# Patient Record
Sex: Female | Born: 1940 | Race: White | Hispanic: No | Marital: Married | Smoking: Never smoker
Health system: Southern US, Community
[De-identification: ages and names within clinical notes are randomized; demographics above are authoritative.]

## PROBLEM LIST (undated history)

## (undated) DIAGNOSIS — I10 Essential (primary) hypertension: Secondary | ICD-10-CM

## (undated) DIAGNOSIS — I5022 Chronic systolic (congestive) heart failure: Secondary | ICD-10-CM

## (undated) DIAGNOSIS — K219 Gastro-esophageal reflux disease without esophagitis: Secondary | ICD-10-CM

## (undated) DIAGNOSIS — F0390 Unspecified dementia without behavioral disturbance: Secondary | ICD-10-CM

## (undated) DIAGNOSIS — E119 Type 2 diabetes mellitus without complications: Secondary | ICD-10-CM

## (undated) HISTORY — DX: Essential (primary) hypertension: I10

## (undated) HISTORY — PX: CORONARY ARTERY BYPASS GRAFT: SHX141

## (undated) HISTORY — PX: ABDOMINAL HYSTERECTOMY: SUR658

## (undated) HISTORY — PX: CARDIAC CATHETERIZATION: SHX172

## (undated) HISTORY — DX: Gastro-esophageal reflux disease without esophagitis: K21.9

## (undated) HISTORY — DX: Type 2 diabetes mellitus without complications: E11.9

## (undated) HISTORY — PX: ANGIOPLASTY: SHX39

---

## 1998-05-03 ENCOUNTER — Inpatient Hospital Stay (HOSPITAL_COMMUNITY): Admission: AD | Admit: 1998-05-03 | Discharge: 1998-05-15 | Payer: Self-pay | Admitting: Internal Medicine

## 1998-05-07 ENCOUNTER — Encounter: Payer: Self-pay | Admitting: Internal Medicine

## 1998-05-10 ENCOUNTER — Encounter: Payer: Self-pay | Admitting: Internal Medicine

## 1998-05-11 ENCOUNTER — Encounter: Payer: Self-pay | Admitting: Internal Medicine

## 1998-05-12 ENCOUNTER — Encounter: Payer: Self-pay | Admitting: Cardiothoracic Surgery

## 2014-03-01 DIAGNOSIS — E11351 Type 2 diabetes mellitus with proliferative diabetic retinopathy with macular edema: Secondary | ICD-10-CM | POA: Diagnosis not present

## 2014-04-18 DIAGNOSIS — E119 Type 2 diabetes mellitus without complications: Secondary | ICD-10-CM | POA: Diagnosis not present

## 2014-04-18 DIAGNOSIS — I10 Essential (primary) hypertension: Secondary | ICD-10-CM | POA: Diagnosis not present

## 2014-04-18 DIAGNOSIS — I2510 Atherosclerotic heart disease of native coronary artery without angina pectoris: Secondary | ICD-10-CM | POA: Diagnosis not present

## 2014-04-18 DIAGNOSIS — D638 Anemia in other chronic diseases classified elsewhere: Secondary | ICD-10-CM | POA: Diagnosis not present

## 2014-04-18 DIAGNOSIS — E782 Mixed hyperlipidemia: Secondary | ICD-10-CM | POA: Diagnosis not present

## 2014-04-19 DIAGNOSIS — E11351 Type 2 diabetes mellitus with proliferative diabetic retinopathy with macular edema: Secondary | ICD-10-CM | POA: Diagnosis not present

## 2014-05-31 DIAGNOSIS — H3532 Exudative age-related macular degeneration: Secondary | ICD-10-CM | POA: Diagnosis not present

## 2014-05-31 DIAGNOSIS — E11351 Type 2 diabetes mellitus with proliferative diabetic retinopathy with macular edema: Secondary | ICD-10-CM | POA: Diagnosis not present

## 2014-07-12 DIAGNOSIS — E11351 Type 2 diabetes mellitus with proliferative diabetic retinopathy with macular edema: Secondary | ICD-10-CM | POA: Diagnosis not present

## 2014-07-12 DIAGNOSIS — H3532 Exudative age-related macular degeneration: Secondary | ICD-10-CM | POA: Diagnosis not present

## 2014-07-19 DIAGNOSIS — I10 Essential (primary) hypertension: Secondary | ICD-10-CM | POA: Diagnosis not present

## 2014-07-19 DIAGNOSIS — E119 Type 2 diabetes mellitus without complications: Secondary | ICD-10-CM | POA: Diagnosis not present

## 2014-07-19 DIAGNOSIS — E782 Mixed hyperlipidemia: Secondary | ICD-10-CM | POA: Diagnosis not present

## 2014-07-19 DIAGNOSIS — Z6828 Body mass index (BMI) 28.0-28.9, adult: Secondary | ICD-10-CM | POA: Diagnosis not present

## 2014-07-19 DIAGNOSIS — Z139 Encounter for screening, unspecified: Secondary | ICD-10-CM | POA: Diagnosis not present

## 2014-07-20 DIAGNOSIS — E559 Vitamin D deficiency, unspecified: Secondary | ICD-10-CM | POA: Diagnosis not present

## 2014-07-20 DIAGNOSIS — E119 Type 2 diabetes mellitus without complications: Secondary | ICD-10-CM | POA: Diagnosis not present

## 2014-07-20 DIAGNOSIS — E782 Mixed hyperlipidemia: Secondary | ICD-10-CM | POA: Diagnosis not present

## 2014-07-20 DIAGNOSIS — Z79899 Other long term (current) drug therapy: Secondary | ICD-10-CM | POA: Diagnosis not present

## 2014-08-14 DIAGNOSIS — I10 Essential (primary) hypertension: Secondary | ICD-10-CM

## 2014-08-14 DIAGNOSIS — E785 Hyperlipidemia, unspecified: Secondary | ICD-10-CM

## 2014-08-14 DIAGNOSIS — I25810 Atherosclerosis of coronary artery bypass graft(s) without angina pectoris: Secondary | ICD-10-CM

## 2014-08-14 HISTORY — DX: Hyperlipidemia, unspecified: E78.5

## 2014-08-14 HISTORY — DX: Atherosclerosis of coronary artery bypass graft(s) without angina pectoris: I25.810

## 2014-08-14 HISTORY — DX: Essential (primary) hypertension: I10

## 2014-08-16 DIAGNOSIS — E11351 Type 2 diabetes mellitus with proliferative diabetic retinopathy with macular edema: Secondary | ICD-10-CM | POA: Diagnosis not present

## 2014-10-24 DIAGNOSIS — Z6827 Body mass index (BMI) 27.0-27.9, adult: Secondary | ICD-10-CM | POA: Diagnosis not present

## 2014-10-24 DIAGNOSIS — I2510 Atherosclerotic heart disease of native coronary artery without angina pectoris: Secondary | ICD-10-CM | POA: Diagnosis not present

## 2014-10-24 DIAGNOSIS — E119 Type 2 diabetes mellitus without complications: Secondary | ICD-10-CM | POA: Diagnosis not present

## 2014-10-24 DIAGNOSIS — E782 Mixed hyperlipidemia: Secondary | ICD-10-CM | POA: Diagnosis not present

## 2014-10-24 DIAGNOSIS — I10 Essential (primary) hypertension: Secondary | ICD-10-CM | POA: Diagnosis not present

## 2014-11-28 DIAGNOSIS — E119 Type 2 diabetes mellitus without complications: Secondary | ICD-10-CM | POA: Diagnosis not present

## 2014-11-28 DIAGNOSIS — R221 Localized swelling, mass and lump, neck: Secondary | ICD-10-CM | POA: Diagnosis not present

## 2014-11-28 DIAGNOSIS — R0600 Dyspnea, unspecified: Secondary | ICD-10-CM | POA: Diagnosis not present

## 2014-11-28 DIAGNOSIS — I2510 Atherosclerotic heart disease of native coronary artery without angina pectoris: Secondary | ICD-10-CM | POA: Diagnosis not present

## 2014-11-28 DIAGNOSIS — Z7982 Long term (current) use of aspirin: Secondary | ICD-10-CM | POA: Diagnosis not present

## 2014-11-28 DIAGNOSIS — Z79899 Other long term (current) drug therapy: Secondary | ICD-10-CM | POA: Diagnosis not present

## 2014-11-28 DIAGNOSIS — K219 Gastro-esophageal reflux disease without esophagitis: Secondary | ICD-10-CM | POA: Diagnosis not present

## 2014-11-28 DIAGNOSIS — E7800 Pure hypercholesterolemia, unspecified: Secondary | ICD-10-CM | POA: Diagnosis not present

## 2014-11-28 DIAGNOSIS — I10 Essential (primary) hypertension: Secondary | ICD-10-CM | POA: Diagnosis not present

## 2014-11-28 DIAGNOSIS — J392 Other diseases of pharynx: Secondary | ICD-10-CM | POA: Diagnosis not present

## 2014-11-28 DIAGNOSIS — R6889 Other general symptoms and signs: Secondary | ICD-10-CM | POA: Diagnosis not present

## 2014-12-14 DIAGNOSIS — Z23 Encounter for immunization: Secondary | ICD-10-CM | POA: Diagnosis not present

## 2015-02-07 DIAGNOSIS — I10 Essential (primary) hypertension: Secondary | ICD-10-CM | POA: Diagnosis not present

## 2015-02-07 DIAGNOSIS — I25810 Atherosclerosis of coronary artery bypass graft(s) without angina pectoris: Secondary | ICD-10-CM | POA: Diagnosis not present

## 2015-02-07 DIAGNOSIS — E785 Hyperlipidemia, unspecified: Secondary | ICD-10-CM | POA: Diagnosis not present

## 2015-02-14 DIAGNOSIS — E782 Mixed hyperlipidemia: Secondary | ICD-10-CM | POA: Diagnosis not present

## 2015-02-14 DIAGNOSIS — E119 Type 2 diabetes mellitus without complications: Secondary | ICD-10-CM | POA: Diagnosis not present

## 2015-02-14 DIAGNOSIS — Z1389 Encounter for screening for other disorder: Secondary | ICD-10-CM | POA: Diagnosis not present

## 2015-02-14 DIAGNOSIS — E559 Vitamin D deficiency, unspecified: Secondary | ICD-10-CM | POA: Diagnosis not present

## 2015-02-14 DIAGNOSIS — E663 Overweight: Secondary | ICD-10-CM | POA: Diagnosis not present

## 2015-02-14 DIAGNOSIS — Z79899 Other long term (current) drug therapy: Secondary | ICD-10-CM | POA: Diagnosis not present

## 2015-02-14 DIAGNOSIS — I10 Essential (primary) hypertension: Secondary | ICD-10-CM | POA: Diagnosis not present

## 2015-06-17 DIAGNOSIS — E782 Mixed hyperlipidemia: Secondary | ICD-10-CM | POA: Diagnosis not present

## 2015-06-17 DIAGNOSIS — N3281 Overactive bladder: Secondary | ICD-10-CM | POA: Diagnosis not present

## 2015-06-17 DIAGNOSIS — E119 Type 2 diabetes mellitus without complications: Secondary | ICD-10-CM | POA: Diagnosis not present

## 2015-06-17 DIAGNOSIS — I10 Essential (primary) hypertension: Secondary | ICD-10-CM | POA: Diagnosis not present

## 2015-06-17 DIAGNOSIS — Z79899 Other long term (current) drug therapy: Secondary | ICD-10-CM | POA: Diagnosis not present

## 2015-06-17 DIAGNOSIS — Z9181 History of falling: Secondary | ICD-10-CM | POA: Diagnosis not present

## 2015-09-17 DIAGNOSIS — E785 Hyperlipidemia, unspecified: Secondary | ICD-10-CM | POA: Diagnosis not present

## 2015-09-17 DIAGNOSIS — I10 Essential (primary) hypertension: Secondary | ICD-10-CM | POA: Diagnosis not present

## 2015-09-17 DIAGNOSIS — I25810 Atherosclerosis of coronary artery bypass graft(s) without angina pectoris: Secondary | ICD-10-CM | POA: Diagnosis not present

## 2015-10-22 DIAGNOSIS — E119 Type 2 diabetes mellitus without complications: Secondary | ICD-10-CM | POA: Diagnosis not present

## 2015-10-22 DIAGNOSIS — E559 Vitamin D deficiency, unspecified: Secondary | ICD-10-CM | POA: Diagnosis not present

## 2015-10-22 DIAGNOSIS — E782 Mixed hyperlipidemia: Secondary | ICD-10-CM | POA: Diagnosis not present

## 2015-10-22 DIAGNOSIS — I10 Essential (primary) hypertension: Secondary | ICD-10-CM | POA: Diagnosis not present

## 2015-10-22 DIAGNOSIS — Z0000 Encounter for general adult medical examination without abnormal findings: Secondary | ICD-10-CM | POA: Diagnosis not present

## 2015-10-23 DIAGNOSIS — Z79899 Other long term (current) drug therapy: Secondary | ICD-10-CM | POA: Diagnosis not present

## 2015-10-23 DIAGNOSIS — E782 Mixed hyperlipidemia: Secondary | ICD-10-CM | POA: Diagnosis not present

## 2015-10-23 DIAGNOSIS — E559 Vitamin D deficiency, unspecified: Secondary | ICD-10-CM | POA: Diagnosis not present

## 2015-10-23 DIAGNOSIS — E119 Type 2 diabetes mellitus without complications: Secondary | ICD-10-CM | POA: Diagnosis not present

## 2015-12-13 DIAGNOSIS — Z23 Encounter for immunization: Secondary | ICD-10-CM | POA: Diagnosis not present

## 2016-01-21 DIAGNOSIS — Z23 Encounter for immunization: Secondary | ICD-10-CM | POA: Diagnosis not present

## 2016-01-21 DIAGNOSIS — I2510 Atherosclerotic heart disease of native coronary artery without angina pectoris: Secondary | ICD-10-CM | POA: Diagnosis not present

## 2016-01-21 DIAGNOSIS — I10 Essential (primary) hypertension: Secondary | ICD-10-CM | POA: Diagnosis not present

## 2016-01-21 DIAGNOSIS — E119 Type 2 diabetes mellitus without complications: Secondary | ICD-10-CM | POA: Diagnosis not present

## 2016-03-23 DIAGNOSIS — I10 Essential (primary) hypertension: Secondary | ICD-10-CM | POA: Diagnosis not present

## 2016-03-23 DIAGNOSIS — I25810 Atherosclerosis of coronary artery bypass graft(s) without angina pectoris: Secondary | ICD-10-CM | POA: Diagnosis not present

## 2016-03-23 DIAGNOSIS — E785 Hyperlipidemia, unspecified: Secondary | ICD-10-CM | POA: Diagnosis not present

## 2016-05-19 DIAGNOSIS — D638 Anemia in other chronic diseases classified elsewhere: Secondary | ICD-10-CM | POA: Diagnosis not present

## 2016-05-19 DIAGNOSIS — E782 Mixed hyperlipidemia: Secondary | ICD-10-CM | POA: Diagnosis not present

## 2016-05-19 DIAGNOSIS — E119 Type 2 diabetes mellitus without complications: Secondary | ICD-10-CM | POA: Diagnosis not present

## 2016-06-02 DIAGNOSIS — Z1389 Encounter for screening for other disorder: Secondary | ICD-10-CM | POA: Diagnosis not present

## 2016-06-02 DIAGNOSIS — I10 Essential (primary) hypertension: Secondary | ICD-10-CM | POA: Diagnosis not present

## 2016-06-02 DIAGNOSIS — E782 Mixed hyperlipidemia: Secondary | ICD-10-CM | POA: Diagnosis not present

## 2016-06-02 DIAGNOSIS — I2510 Atherosclerotic heart disease of native coronary artery without angina pectoris: Secondary | ICD-10-CM | POA: Diagnosis not present

## 2016-06-02 DIAGNOSIS — E119 Type 2 diabetes mellitus without complications: Secondary | ICD-10-CM | POA: Diagnosis not present

## 2016-06-25 DIAGNOSIS — D649 Anemia, unspecified: Secondary | ICD-10-CM | POA: Diagnosis not present

## 2016-07-10 DIAGNOSIS — D649 Anemia, unspecified: Secondary | ICD-10-CM | POA: Diagnosis not present

## 2016-07-17 DIAGNOSIS — K5730 Diverticulosis of large intestine without perforation or abscess without bleeding: Secondary | ICD-10-CM | POA: Diagnosis not present

## 2016-07-17 DIAGNOSIS — D649 Anemia, unspecified: Secondary | ICD-10-CM | POA: Diagnosis not present

## 2016-07-17 DIAGNOSIS — D126 Benign neoplasm of colon, unspecified: Secondary | ICD-10-CM | POA: Diagnosis not present

## 2016-07-17 DIAGNOSIS — Z791 Long term (current) use of non-steroidal anti-inflammatories (NSAID): Secondary | ICD-10-CM | POA: Diagnosis not present

## 2016-07-17 DIAGNOSIS — K2970 Gastritis, unspecified, without bleeding: Secondary | ICD-10-CM | POA: Diagnosis not present

## 2016-07-17 DIAGNOSIS — D125 Benign neoplasm of sigmoid colon: Secondary | ICD-10-CM | POA: Diagnosis not present

## 2016-07-17 DIAGNOSIS — R1013 Epigastric pain: Secondary | ICD-10-CM | POA: Diagnosis not present

## 2016-07-17 DIAGNOSIS — Z1211 Encounter for screening for malignant neoplasm of colon: Secondary | ICD-10-CM | POA: Diagnosis not present

## 2016-07-23 DIAGNOSIS — D649 Anemia, unspecified: Secondary | ICD-10-CM | POA: Diagnosis not present

## 2016-08-04 DIAGNOSIS — E559 Vitamin D deficiency, unspecified: Secondary | ICD-10-CM | POA: Diagnosis not present

## 2016-08-04 DIAGNOSIS — I2510 Atherosclerotic heart disease of native coronary artery without angina pectoris: Secondary | ICD-10-CM | POA: Diagnosis not present

## 2016-08-04 DIAGNOSIS — I10 Essential (primary) hypertension: Secondary | ICD-10-CM | POA: Diagnosis not present

## 2016-08-04 DIAGNOSIS — Z9181 History of falling: Secondary | ICD-10-CM | POA: Diagnosis not present

## 2016-08-04 DIAGNOSIS — Z139 Encounter for screening, unspecified: Secondary | ICD-10-CM | POA: Diagnosis not present

## 2016-08-04 DIAGNOSIS — E119 Type 2 diabetes mellitus without complications: Secondary | ICD-10-CM | POA: Diagnosis not present

## 2016-08-04 DIAGNOSIS — E782 Mixed hyperlipidemia: Secondary | ICD-10-CM | POA: Diagnosis not present

## 2016-09-07 ENCOUNTER — Ambulatory Visit: Payer: Self-pay | Admitting: Cardiology

## 2016-10-06 DIAGNOSIS — Z01818 Encounter for other preprocedural examination: Secondary | ICD-10-CM | POA: Diagnosis not present

## 2016-10-06 DIAGNOSIS — T148XXA Other injury of unspecified body region, initial encounter: Secondary | ICD-10-CM | POA: Diagnosis not present

## 2016-10-06 DIAGNOSIS — S82201A Unspecified fracture of shaft of right tibia, initial encounter for closed fracture: Secondary | ICD-10-CM | POA: Diagnosis not present

## 2016-10-06 DIAGNOSIS — M25571 Pain in right ankle and joints of right foot: Secondary | ICD-10-CM | POA: Diagnosis not present

## 2016-10-06 DIAGNOSIS — S82891A Other fracture of right lower leg, initial encounter for closed fracture: Secondary | ICD-10-CM | POA: Diagnosis not present

## 2016-10-06 DIAGNOSIS — M7989 Other specified soft tissue disorders: Secondary | ICD-10-CM | POA: Diagnosis not present

## 2016-10-06 DIAGNOSIS — S8991XA Unspecified injury of right lower leg, initial encounter: Secondary | ICD-10-CM | POA: Diagnosis not present

## 2016-10-06 DIAGNOSIS — S82899A Other fracture of unspecified lower leg, initial encounter for closed fracture: Secondary | ICD-10-CM | POA: Diagnosis not present

## 2016-10-06 DIAGNOSIS — S99921A Unspecified injury of right foot, initial encounter: Secondary | ICD-10-CM | POA: Diagnosis not present

## 2016-10-07 DIAGNOSIS — S82841A Displaced bimalleolar fracture of right lower leg, initial encounter for closed fracture: Secondary | ICD-10-CM | POA: Diagnosis not present

## 2016-10-08 DIAGNOSIS — S82841A Displaced bimalleolar fracture of right lower leg, initial encounter for closed fracture: Secondary | ICD-10-CM | POA: Diagnosis not present

## 2016-10-09 DIAGNOSIS — I10 Essential (primary) hypertension: Secondary | ICD-10-CM | POA: Diagnosis not present

## 2016-10-09 DIAGNOSIS — I249 Acute ischemic heart disease, unspecified: Secondary | ICD-10-CM | POA: Diagnosis not present

## 2016-10-09 DIAGNOSIS — Z79899 Other long term (current) drug therapy: Secondary | ICD-10-CM | POA: Diagnosis not present

## 2016-10-09 DIAGNOSIS — Z7401 Bed confinement status: Secondary | ICD-10-CM | POA: Diagnosis not present

## 2016-10-09 DIAGNOSIS — Z9181 History of falling: Secondary | ICD-10-CM | POA: Diagnosis not present

## 2016-10-09 DIAGNOSIS — Z951 Presence of aortocoronary bypass graft: Secondary | ICD-10-CM | POA: Diagnosis not present

## 2016-10-09 DIAGNOSIS — W010XXA Fall on same level from slipping, tripping and stumbling without subsequent striking against object, initial encounter: Secondary | ICD-10-CM | POA: Diagnosis not present

## 2016-10-09 DIAGNOSIS — I2510 Atherosclerotic heart disease of native coronary artery without angina pectoris: Secondary | ICD-10-CM | POA: Diagnosis not present

## 2016-10-09 DIAGNOSIS — R749 Abnormal serum enzyme level, unspecified: Secondary | ICD-10-CM | POA: Diagnosis not present

## 2016-10-09 DIAGNOSIS — E7800 Pure hypercholesterolemia, unspecified: Secondary | ICD-10-CM | POA: Diagnosis not present

## 2016-10-09 DIAGNOSIS — R279 Unspecified lack of coordination: Secondary | ICD-10-CM | POA: Diagnosis not present

## 2016-10-09 DIAGNOSIS — S82891D Other fracture of right lower leg, subsequent encounter for closed fracture with routine healing: Secondary | ICD-10-CM | POA: Diagnosis not present

## 2016-10-09 DIAGNOSIS — R262 Difficulty in walking, not elsewhere classified: Secondary | ICD-10-CM | POA: Diagnosis not present

## 2016-10-09 DIAGNOSIS — Z7982 Long term (current) use of aspirin: Secondary | ICD-10-CM | POA: Diagnosis not present

## 2016-10-09 DIAGNOSIS — E119 Type 2 diabetes mellitus without complications: Secondary | ICD-10-CM | POA: Diagnosis not present

## 2016-10-09 DIAGNOSIS — K219 Gastro-esophageal reflux disease without esophagitis: Secondary | ICD-10-CM | POA: Diagnosis not present

## 2016-10-09 DIAGNOSIS — M6281 Muscle weakness (generalized): Secondary | ICD-10-CM | POA: Diagnosis not present

## 2016-10-09 DIAGNOSIS — Z7409 Other reduced mobility: Secondary | ICD-10-CM | POA: Diagnosis not present

## 2016-10-09 DIAGNOSIS — Z7984 Long term (current) use of oral hypoglycemic drugs: Secondary | ICD-10-CM | POA: Diagnosis not present

## 2016-10-09 DIAGNOSIS — R079 Chest pain, unspecified: Secondary | ICD-10-CM | POA: Diagnosis not present

## 2016-10-09 DIAGNOSIS — R748 Abnormal levels of other serum enzymes: Secondary | ICD-10-CM | POA: Diagnosis not present

## 2016-10-09 DIAGNOSIS — S82841A Displaced bimalleolar fracture of right lower leg, initial encounter for closed fracture: Secondary | ICD-10-CM | POA: Diagnosis not present

## 2016-10-09 DIAGNOSIS — Z4789 Encounter for other orthopedic aftercare: Secondary | ICD-10-CM | POA: Diagnosis not present

## 2016-10-09 DIAGNOSIS — S82841D Displaced bimalleolar fracture of right lower leg, subsequent encounter for closed fracture with routine healing: Secondary | ICD-10-CM | POA: Diagnosis not present

## 2016-10-09 DIAGNOSIS — Z79891 Long term (current) use of opiate analgesic: Secondary | ICD-10-CM | POA: Diagnosis not present

## 2016-10-09 DIAGNOSIS — Z741 Need for assistance with personal care: Secondary | ICD-10-CM | POA: Diagnosis not present

## 2016-10-09 DIAGNOSIS — I252 Old myocardial infarction: Secondary | ICD-10-CM | POA: Diagnosis not present

## 2016-10-10 DIAGNOSIS — R749 Abnormal serum enzyme level, unspecified: Secondary | ICD-10-CM | POA: Diagnosis not present

## 2016-10-11 DIAGNOSIS — R749 Abnormal serum enzyme level, unspecified: Secondary | ICD-10-CM | POA: Diagnosis not present

## 2016-10-12 DIAGNOSIS — E039 Hypothyroidism, unspecified: Secondary | ICD-10-CM | POA: Diagnosis not present

## 2016-10-12 DIAGNOSIS — W010XXA Fall on same level from slipping, tripping and stumbling without subsequent striking against object, initial encounter: Secondary | ICD-10-CM | POA: Diagnosis not present

## 2016-10-12 DIAGNOSIS — E559 Vitamin D deficiency, unspecified: Secondary | ICD-10-CM | POA: Diagnosis not present

## 2016-10-12 DIAGNOSIS — R110 Nausea: Secondary | ICD-10-CM | POA: Diagnosis not present

## 2016-10-12 DIAGNOSIS — Z7409 Other reduced mobility: Secondary | ICD-10-CM | POA: Diagnosis not present

## 2016-10-12 DIAGNOSIS — Z4789 Encounter for other orthopedic aftercare: Secondary | ICD-10-CM | POA: Diagnosis not present

## 2016-10-12 DIAGNOSIS — E119 Type 2 diabetes mellitus without complications: Secondary | ICD-10-CM | POA: Diagnosis not present

## 2016-10-12 DIAGNOSIS — I249 Acute ischemic heart disease, unspecified: Secondary | ICD-10-CM | POA: Diagnosis not present

## 2016-10-12 DIAGNOSIS — M6281 Muscle weakness (generalized): Secondary | ICD-10-CM | POA: Diagnosis not present

## 2016-10-12 DIAGNOSIS — I2510 Atherosclerotic heart disease of native coronary artery without angina pectoris: Secondary | ICD-10-CM | POA: Diagnosis not present

## 2016-10-12 DIAGNOSIS — D649 Anemia, unspecified: Secondary | ICD-10-CM | POA: Diagnosis not present

## 2016-10-12 DIAGNOSIS — R279 Unspecified lack of coordination: Secondary | ICD-10-CM | POA: Diagnosis not present

## 2016-10-12 DIAGNOSIS — K5900 Constipation, unspecified: Secondary | ICD-10-CM | POA: Diagnosis not present

## 2016-10-12 DIAGNOSIS — Z79899 Other long term (current) drug therapy: Secondary | ICD-10-CM | POA: Diagnosis not present

## 2016-10-12 DIAGNOSIS — R300 Dysuria: Secondary | ICD-10-CM | POA: Diagnosis not present

## 2016-10-12 DIAGNOSIS — K219 Gastro-esophageal reflux disease without esophagitis: Secondary | ICD-10-CM | POA: Diagnosis not present

## 2016-10-12 DIAGNOSIS — Z7401 Bed confinement status: Secondary | ICD-10-CM | POA: Diagnosis not present

## 2016-10-12 DIAGNOSIS — Z9181 History of falling: Secondary | ICD-10-CM | POA: Diagnosis not present

## 2016-10-12 DIAGNOSIS — E7800 Pure hypercholesterolemia, unspecified: Secondary | ICD-10-CM | POA: Diagnosis not present

## 2016-10-12 DIAGNOSIS — R262 Difficulty in walking, not elsewhere classified: Secondary | ICD-10-CM | POA: Diagnosis not present

## 2016-10-12 DIAGNOSIS — I252 Old myocardial infarction: Secondary | ICD-10-CM | POA: Diagnosis not present

## 2016-10-12 DIAGNOSIS — Z741 Need for assistance with personal care: Secondary | ICD-10-CM | POA: Diagnosis not present

## 2016-10-12 DIAGNOSIS — N390 Urinary tract infection, site not specified: Secondary | ICD-10-CM | POA: Diagnosis not present

## 2016-10-12 DIAGNOSIS — Z951 Presence of aortocoronary bypass graft: Secondary | ICD-10-CM | POA: Diagnosis not present

## 2016-10-12 DIAGNOSIS — E871 Hypo-osmolality and hyponatremia: Secondary | ICD-10-CM | POA: Diagnosis not present

## 2016-10-12 DIAGNOSIS — R319 Hematuria, unspecified: Secondary | ICD-10-CM | POA: Diagnosis not present

## 2016-10-12 DIAGNOSIS — S82841A Displaced bimalleolar fracture of right lower leg, initial encounter for closed fracture: Secondary | ICD-10-CM | POA: Diagnosis not present

## 2016-10-12 DIAGNOSIS — E785 Hyperlipidemia, unspecified: Secondary | ICD-10-CM | POA: Diagnosis not present

## 2016-10-12 DIAGNOSIS — S82891D Other fracture of right lower leg, subsequent encounter for closed fracture with routine healing: Secondary | ICD-10-CM | POA: Diagnosis not present

## 2016-10-12 DIAGNOSIS — I10 Essential (primary) hypertension: Secondary | ICD-10-CM | POA: Diagnosis not present

## 2016-10-12 DIAGNOSIS — S82841D Displaced bimalleolar fracture of right lower leg, subsequent encounter for closed fracture with routine healing: Secondary | ICD-10-CM | POA: Diagnosis not present

## 2016-10-16 DIAGNOSIS — S82841D Displaced bimalleolar fracture of right lower leg, subsequent encounter for closed fracture with routine healing: Secondary | ICD-10-CM | POA: Diagnosis not present

## 2016-10-16 DIAGNOSIS — E119 Type 2 diabetes mellitus without complications: Secondary | ICD-10-CM | POA: Diagnosis not present

## 2016-10-16 DIAGNOSIS — I10 Essential (primary) hypertension: Secondary | ICD-10-CM | POA: Diagnosis not present

## 2016-10-16 DIAGNOSIS — E7800 Pure hypercholesterolemia, unspecified: Secondary | ICD-10-CM | POA: Diagnosis not present

## 2016-10-21 DIAGNOSIS — K5900 Constipation, unspecified: Secondary | ICD-10-CM | POA: Diagnosis not present

## 2016-10-21 DIAGNOSIS — R110 Nausea: Secondary | ICD-10-CM | POA: Diagnosis not present

## 2016-10-23 DIAGNOSIS — E119 Type 2 diabetes mellitus without complications: Secondary | ICD-10-CM | POA: Diagnosis not present

## 2016-10-23 DIAGNOSIS — S82841D Displaced bimalleolar fracture of right lower leg, subsequent encounter for closed fracture with routine healing: Secondary | ICD-10-CM | POA: Diagnosis not present

## 2016-10-23 DIAGNOSIS — I10 Essential (primary) hypertension: Secondary | ICD-10-CM | POA: Diagnosis not present

## 2016-10-23 DIAGNOSIS — E7800 Pure hypercholesterolemia, unspecified: Secondary | ICD-10-CM | POA: Diagnosis not present

## 2016-10-28 DIAGNOSIS — E119 Type 2 diabetes mellitus without complications: Secondary | ICD-10-CM | POA: Diagnosis not present

## 2016-10-28 DIAGNOSIS — S82841D Displaced bimalleolar fracture of right lower leg, subsequent encounter for closed fracture with routine healing: Secondary | ICD-10-CM | POA: Diagnosis not present

## 2016-10-28 DIAGNOSIS — M6281 Muscle weakness (generalized): Secondary | ICD-10-CM | POA: Diagnosis not present

## 2016-10-28 DIAGNOSIS — R262 Difficulty in walking, not elsewhere classified: Secondary | ICD-10-CM | POA: Diagnosis not present

## 2016-10-29 DIAGNOSIS — Z7982 Long term (current) use of aspirin: Secondary | ICD-10-CM | POA: Diagnosis not present

## 2016-10-29 DIAGNOSIS — I2510 Atherosclerotic heart disease of native coronary artery without angina pectoris: Secondary | ICD-10-CM | POA: Diagnosis not present

## 2016-10-29 DIAGNOSIS — W19XXXD Unspecified fall, subsequent encounter: Secondary | ICD-10-CM | POA: Diagnosis not present

## 2016-10-29 DIAGNOSIS — S82841D Displaced bimalleolar fracture of right lower leg, subsequent encounter for closed fracture with routine healing: Secondary | ICD-10-CM | POA: Diagnosis not present

## 2016-10-29 DIAGNOSIS — Z9181 History of falling: Secondary | ICD-10-CM | POA: Diagnosis not present

## 2016-10-29 DIAGNOSIS — E119 Type 2 diabetes mellitus without complications: Secondary | ICD-10-CM | POA: Diagnosis not present

## 2016-10-29 DIAGNOSIS — E7800 Pure hypercholesterolemia, unspecified: Secondary | ICD-10-CM | POA: Diagnosis not present

## 2016-10-29 DIAGNOSIS — K219 Gastro-esophageal reflux disease without esophagitis: Secondary | ICD-10-CM | POA: Diagnosis not present

## 2016-10-29 DIAGNOSIS — I10 Essential (primary) hypertension: Secondary | ICD-10-CM | POA: Diagnosis not present

## 2016-10-30 DIAGNOSIS — I2510 Atherosclerotic heart disease of native coronary artery without angina pectoris: Secondary | ICD-10-CM | POA: Diagnosis not present

## 2016-10-30 DIAGNOSIS — I10 Essential (primary) hypertension: Secondary | ICD-10-CM | POA: Diagnosis not present

## 2016-10-30 DIAGNOSIS — S82841D Displaced bimalleolar fracture of right lower leg, subsequent encounter for closed fracture with routine healing: Secondary | ICD-10-CM | POA: Diagnosis not present

## 2016-10-30 DIAGNOSIS — E7800 Pure hypercholesterolemia, unspecified: Secondary | ICD-10-CM | POA: Diagnosis not present

## 2016-10-30 DIAGNOSIS — K219 Gastro-esophageal reflux disease without esophagitis: Secondary | ICD-10-CM | POA: Diagnosis not present

## 2016-10-30 DIAGNOSIS — E119 Type 2 diabetes mellitus without complications: Secondary | ICD-10-CM | POA: Diagnosis not present

## 2016-10-30 DIAGNOSIS — Z9181 History of falling: Secondary | ICD-10-CM | POA: Diagnosis not present

## 2016-10-30 DIAGNOSIS — W19XXXD Unspecified fall, subsequent encounter: Secondary | ICD-10-CM | POA: Diagnosis not present

## 2016-10-30 DIAGNOSIS — Z7982 Long term (current) use of aspirin: Secondary | ICD-10-CM | POA: Diagnosis not present

## 2016-11-01 DIAGNOSIS — Z9181 History of falling: Secondary | ICD-10-CM | POA: Diagnosis not present

## 2016-11-01 DIAGNOSIS — K219 Gastro-esophageal reflux disease without esophagitis: Secondary | ICD-10-CM | POA: Diagnosis not present

## 2016-11-01 DIAGNOSIS — Z7982 Long term (current) use of aspirin: Secondary | ICD-10-CM | POA: Diagnosis not present

## 2016-11-01 DIAGNOSIS — E119 Type 2 diabetes mellitus without complications: Secondary | ICD-10-CM | POA: Diagnosis not present

## 2016-11-01 DIAGNOSIS — I10 Essential (primary) hypertension: Secondary | ICD-10-CM | POA: Diagnosis not present

## 2016-11-01 DIAGNOSIS — I2510 Atherosclerotic heart disease of native coronary artery without angina pectoris: Secondary | ICD-10-CM | POA: Diagnosis not present

## 2016-11-01 DIAGNOSIS — W19XXXD Unspecified fall, subsequent encounter: Secondary | ICD-10-CM | POA: Diagnosis not present

## 2016-11-01 DIAGNOSIS — E7800 Pure hypercholesterolemia, unspecified: Secondary | ICD-10-CM | POA: Diagnosis not present

## 2016-11-01 DIAGNOSIS — S82841D Displaced bimalleolar fracture of right lower leg, subsequent encounter for closed fracture with routine healing: Secondary | ICD-10-CM | POA: Diagnosis not present

## 2016-11-05 DIAGNOSIS — I10 Essential (primary) hypertension: Secondary | ICD-10-CM | POA: Diagnosis not present

## 2016-11-05 DIAGNOSIS — I2510 Atherosclerotic heart disease of native coronary artery without angina pectoris: Secondary | ICD-10-CM | POA: Diagnosis not present

## 2016-11-05 DIAGNOSIS — Z9181 History of falling: Secondary | ICD-10-CM | POA: Diagnosis not present

## 2016-11-05 DIAGNOSIS — Z7982 Long term (current) use of aspirin: Secondary | ICD-10-CM | POA: Diagnosis not present

## 2016-11-05 DIAGNOSIS — E119 Type 2 diabetes mellitus without complications: Secondary | ICD-10-CM | POA: Diagnosis not present

## 2016-11-05 DIAGNOSIS — E7800 Pure hypercholesterolemia, unspecified: Secondary | ICD-10-CM | POA: Diagnosis not present

## 2016-11-05 DIAGNOSIS — K219 Gastro-esophageal reflux disease without esophagitis: Secondary | ICD-10-CM | POA: Diagnosis not present

## 2016-11-05 DIAGNOSIS — W19XXXD Unspecified fall, subsequent encounter: Secondary | ICD-10-CM | POA: Diagnosis not present

## 2016-11-05 DIAGNOSIS — S82841D Displaced bimalleolar fracture of right lower leg, subsequent encounter for closed fracture with routine healing: Secondary | ICD-10-CM | POA: Diagnosis not present

## 2016-11-06 DIAGNOSIS — W19XXXD Unspecified fall, subsequent encounter: Secondary | ICD-10-CM | POA: Diagnosis not present

## 2016-11-06 DIAGNOSIS — N390 Urinary tract infection, site not specified: Secondary | ICD-10-CM | POA: Diagnosis not present

## 2016-11-06 DIAGNOSIS — I10 Essential (primary) hypertension: Secondary | ICD-10-CM | POA: Diagnosis not present

## 2016-11-06 DIAGNOSIS — E119 Type 2 diabetes mellitus without complications: Secondary | ICD-10-CM | POA: Diagnosis not present

## 2016-11-06 DIAGNOSIS — K219 Gastro-esophageal reflux disease without esophagitis: Secondary | ICD-10-CM | POA: Diagnosis not present

## 2016-11-06 DIAGNOSIS — Z7982 Long term (current) use of aspirin: Secondary | ICD-10-CM | POA: Diagnosis not present

## 2016-11-06 DIAGNOSIS — E7800 Pure hypercholesterolemia, unspecified: Secondary | ICD-10-CM | POA: Diagnosis not present

## 2016-11-06 DIAGNOSIS — Z9181 History of falling: Secondary | ICD-10-CM | POA: Diagnosis not present

## 2016-11-06 DIAGNOSIS — I2510 Atherosclerotic heart disease of native coronary artery without angina pectoris: Secondary | ICD-10-CM | POA: Diagnosis not present

## 2016-11-06 DIAGNOSIS — S82841D Displaced bimalleolar fracture of right lower leg, subsequent encounter for closed fracture with routine healing: Secondary | ICD-10-CM | POA: Diagnosis not present

## 2016-11-09 DIAGNOSIS — I10 Essential (primary) hypertension: Secondary | ICD-10-CM | POA: Diagnosis not present

## 2016-11-09 DIAGNOSIS — E7800 Pure hypercholesterolemia, unspecified: Secondary | ICD-10-CM | POA: Diagnosis not present

## 2016-11-09 DIAGNOSIS — Z9181 History of falling: Secondary | ICD-10-CM | POA: Diagnosis not present

## 2016-11-09 DIAGNOSIS — Z7982 Long term (current) use of aspirin: Secondary | ICD-10-CM | POA: Diagnosis not present

## 2016-11-09 DIAGNOSIS — W19XXXD Unspecified fall, subsequent encounter: Secondary | ICD-10-CM | POA: Diagnosis not present

## 2016-11-09 DIAGNOSIS — K219 Gastro-esophageal reflux disease without esophagitis: Secondary | ICD-10-CM | POA: Diagnosis not present

## 2016-11-09 DIAGNOSIS — E119 Type 2 diabetes mellitus without complications: Secondary | ICD-10-CM | POA: Diagnosis not present

## 2016-11-09 DIAGNOSIS — S82841D Displaced bimalleolar fracture of right lower leg, subsequent encounter for closed fracture with routine healing: Secondary | ICD-10-CM | POA: Diagnosis not present

## 2016-11-09 DIAGNOSIS — I2510 Atherosclerotic heart disease of native coronary artery without angina pectoris: Secondary | ICD-10-CM | POA: Diagnosis not present

## 2016-11-10 DIAGNOSIS — R5383 Other fatigue: Secondary | ICD-10-CM | POA: Diagnosis not present

## 2016-11-10 DIAGNOSIS — S91001A Unspecified open wound, right ankle, initial encounter: Secondary | ICD-10-CM | POA: Diagnosis not present

## 2016-11-10 DIAGNOSIS — S82891A Other fracture of right lower leg, initial encounter for closed fracture: Secondary | ICD-10-CM | POA: Diagnosis not present

## 2016-11-10 DIAGNOSIS — D638 Anemia in other chronic diseases classified elsewhere: Secondary | ICD-10-CM | POA: Diagnosis not present

## 2016-11-10 DIAGNOSIS — Z79899 Other long term (current) drug therapy: Secondary | ICD-10-CM | POA: Diagnosis not present

## 2016-11-10 DIAGNOSIS — Z09 Encounter for follow-up examination after completed treatment for conditions other than malignant neoplasm: Secondary | ICD-10-CM | POA: Diagnosis not present

## 2016-11-10 DIAGNOSIS — Z23 Encounter for immunization: Secondary | ICD-10-CM | POA: Diagnosis not present

## 2016-11-11 DIAGNOSIS — Z9181 History of falling: Secondary | ICD-10-CM | POA: Diagnosis not present

## 2016-11-11 DIAGNOSIS — I2510 Atherosclerotic heart disease of native coronary artery without angina pectoris: Secondary | ICD-10-CM | POA: Diagnosis not present

## 2016-11-11 DIAGNOSIS — W19XXXD Unspecified fall, subsequent encounter: Secondary | ICD-10-CM | POA: Diagnosis not present

## 2016-11-11 DIAGNOSIS — E7800 Pure hypercholesterolemia, unspecified: Secondary | ICD-10-CM | POA: Diagnosis not present

## 2016-11-11 DIAGNOSIS — E119 Type 2 diabetes mellitus without complications: Secondary | ICD-10-CM | POA: Diagnosis not present

## 2016-11-11 DIAGNOSIS — S82841D Displaced bimalleolar fracture of right lower leg, subsequent encounter for closed fracture with routine healing: Secondary | ICD-10-CM | POA: Diagnosis not present

## 2016-11-11 DIAGNOSIS — Z7982 Long term (current) use of aspirin: Secondary | ICD-10-CM | POA: Diagnosis not present

## 2016-11-11 DIAGNOSIS — K219 Gastro-esophageal reflux disease without esophagitis: Secondary | ICD-10-CM | POA: Diagnosis not present

## 2016-11-11 DIAGNOSIS — I10 Essential (primary) hypertension: Secondary | ICD-10-CM | POA: Diagnosis not present

## 2016-11-17 DIAGNOSIS — I10 Essential (primary) hypertension: Secondary | ICD-10-CM | POA: Diagnosis not present

## 2016-11-17 DIAGNOSIS — Z7982 Long term (current) use of aspirin: Secondary | ICD-10-CM | POA: Diagnosis not present

## 2016-11-17 DIAGNOSIS — S82841D Displaced bimalleolar fracture of right lower leg, subsequent encounter for closed fracture with routine healing: Secondary | ICD-10-CM | POA: Diagnosis not present

## 2016-11-17 DIAGNOSIS — I2510 Atherosclerotic heart disease of native coronary artery without angina pectoris: Secondary | ICD-10-CM | POA: Diagnosis not present

## 2016-11-17 DIAGNOSIS — Z9181 History of falling: Secondary | ICD-10-CM | POA: Diagnosis not present

## 2016-11-17 DIAGNOSIS — S82841A Displaced bimalleolar fracture of right lower leg, initial encounter for closed fracture: Secondary | ICD-10-CM | POA: Diagnosis not present

## 2016-11-17 DIAGNOSIS — K219 Gastro-esophageal reflux disease without esophagitis: Secondary | ICD-10-CM | POA: Diagnosis not present

## 2016-11-17 DIAGNOSIS — W19XXXD Unspecified fall, subsequent encounter: Secondary | ICD-10-CM | POA: Diagnosis not present

## 2016-11-17 DIAGNOSIS — E7800 Pure hypercholesterolemia, unspecified: Secondary | ICD-10-CM | POA: Diagnosis not present

## 2016-11-17 DIAGNOSIS — E119 Type 2 diabetes mellitus without complications: Secondary | ICD-10-CM | POA: Diagnosis not present

## 2016-11-19 DIAGNOSIS — S91001A Unspecified open wound, right ankle, initial encounter: Secondary | ICD-10-CM | POA: Diagnosis not present

## 2016-11-19 DIAGNOSIS — T8189XA Other complications of procedures, not elsewhere classified, initial encounter: Secondary | ICD-10-CM | POA: Diagnosis not present

## 2016-11-19 DIAGNOSIS — M25571 Pain in right ankle and joints of right foot: Secondary | ICD-10-CM | POA: Diagnosis not present

## 2016-11-19 DIAGNOSIS — I252 Old myocardial infarction: Secondary | ICD-10-CM | POA: Diagnosis not present

## 2016-11-19 DIAGNOSIS — E119 Type 2 diabetes mellitus without complications: Secondary | ICD-10-CM | POA: Diagnosis not present

## 2016-11-19 DIAGNOSIS — I2510 Atherosclerotic heart disease of native coronary artery without angina pectoris: Secondary | ICD-10-CM | POA: Diagnosis not present

## 2016-11-19 DIAGNOSIS — D649 Anemia, unspecified: Secondary | ICD-10-CM | POA: Diagnosis not present

## 2016-11-20 DIAGNOSIS — E119 Type 2 diabetes mellitus without complications: Secondary | ICD-10-CM | POA: Diagnosis not present

## 2016-11-20 DIAGNOSIS — W19XXXD Unspecified fall, subsequent encounter: Secondary | ICD-10-CM | POA: Diagnosis not present

## 2016-11-20 DIAGNOSIS — K219 Gastro-esophageal reflux disease without esophagitis: Secondary | ICD-10-CM | POA: Diagnosis not present

## 2016-11-20 DIAGNOSIS — I2510 Atherosclerotic heart disease of native coronary artery without angina pectoris: Secondary | ICD-10-CM | POA: Diagnosis not present

## 2016-11-20 DIAGNOSIS — E7800 Pure hypercholesterolemia, unspecified: Secondary | ICD-10-CM | POA: Diagnosis not present

## 2016-11-20 DIAGNOSIS — Z7982 Long term (current) use of aspirin: Secondary | ICD-10-CM | POA: Diagnosis not present

## 2016-11-20 DIAGNOSIS — S82841D Displaced bimalleolar fracture of right lower leg, subsequent encounter for closed fracture with routine healing: Secondary | ICD-10-CM | POA: Diagnosis not present

## 2016-11-20 DIAGNOSIS — I10 Essential (primary) hypertension: Secondary | ICD-10-CM | POA: Diagnosis not present

## 2016-11-20 DIAGNOSIS — Z9181 History of falling: Secondary | ICD-10-CM | POA: Diagnosis not present

## 2016-11-21 DIAGNOSIS — E119 Type 2 diabetes mellitus without complications: Secondary | ICD-10-CM | POA: Diagnosis not present

## 2016-11-21 DIAGNOSIS — I10 Essential (primary) hypertension: Secondary | ICD-10-CM | POA: Diagnosis not present

## 2016-11-21 DIAGNOSIS — K219 Gastro-esophageal reflux disease without esophagitis: Secondary | ICD-10-CM | POA: Diagnosis not present

## 2016-11-21 DIAGNOSIS — Z7982 Long term (current) use of aspirin: Secondary | ICD-10-CM | POA: Diagnosis not present

## 2016-11-21 DIAGNOSIS — Z9181 History of falling: Secondary | ICD-10-CM | POA: Diagnosis not present

## 2016-11-21 DIAGNOSIS — E7800 Pure hypercholesterolemia, unspecified: Secondary | ICD-10-CM | POA: Diagnosis not present

## 2016-11-21 DIAGNOSIS — I2510 Atherosclerotic heart disease of native coronary artery without angina pectoris: Secondary | ICD-10-CM | POA: Diagnosis not present

## 2016-11-21 DIAGNOSIS — S82841D Displaced bimalleolar fracture of right lower leg, subsequent encounter for closed fracture with routine healing: Secondary | ICD-10-CM | POA: Diagnosis not present

## 2016-11-21 DIAGNOSIS — W19XXXD Unspecified fall, subsequent encounter: Secondary | ICD-10-CM | POA: Diagnosis not present

## 2016-11-22 DIAGNOSIS — Z7982 Long term (current) use of aspirin: Secondary | ICD-10-CM | POA: Diagnosis not present

## 2016-11-22 DIAGNOSIS — S82841D Displaced bimalleolar fracture of right lower leg, subsequent encounter for closed fracture with routine healing: Secondary | ICD-10-CM | POA: Diagnosis not present

## 2016-11-22 DIAGNOSIS — I10 Essential (primary) hypertension: Secondary | ICD-10-CM | POA: Diagnosis not present

## 2016-11-22 DIAGNOSIS — Z9181 History of falling: Secondary | ICD-10-CM | POA: Diagnosis not present

## 2016-11-22 DIAGNOSIS — E119 Type 2 diabetes mellitus without complications: Secondary | ICD-10-CM | POA: Diagnosis not present

## 2016-11-22 DIAGNOSIS — K219 Gastro-esophageal reflux disease without esophagitis: Secondary | ICD-10-CM | POA: Diagnosis not present

## 2016-11-22 DIAGNOSIS — E7800 Pure hypercholesterolemia, unspecified: Secondary | ICD-10-CM | POA: Diagnosis not present

## 2016-11-22 DIAGNOSIS — I2510 Atherosclerotic heart disease of native coronary artery without angina pectoris: Secondary | ICD-10-CM | POA: Diagnosis not present

## 2016-11-22 DIAGNOSIS — W19XXXD Unspecified fall, subsequent encounter: Secondary | ICD-10-CM | POA: Diagnosis not present

## 2016-11-23 DIAGNOSIS — W19XXXD Unspecified fall, subsequent encounter: Secondary | ICD-10-CM | POA: Diagnosis not present

## 2016-11-23 DIAGNOSIS — I2510 Atherosclerotic heart disease of native coronary artery without angina pectoris: Secondary | ICD-10-CM | POA: Diagnosis not present

## 2016-11-23 DIAGNOSIS — E119 Type 2 diabetes mellitus without complications: Secondary | ICD-10-CM | POA: Diagnosis not present

## 2016-11-23 DIAGNOSIS — Z7982 Long term (current) use of aspirin: Secondary | ICD-10-CM | POA: Diagnosis not present

## 2016-11-23 DIAGNOSIS — I10 Essential (primary) hypertension: Secondary | ICD-10-CM | POA: Diagnosis not present

## 2016-11-23 DIAGNOSIS — Z9181 History of falling: Secondary | ICD-10-CM | POA: Diagnosis not present

## 2016-11-23 DIAGNOSIS — K219 Gastro-esophageal reflux disease without esophagitis: Secondary | ICD-10-CM | POA: Diagnosis not present

## 2016-11-23 DIAGNOSIS — S82841D Displaced bimalleolar fracture of right lower leg, subsequent encounter for closed fracture with routine healing: Secondary | ICD-10-CM | POA: Diagnosis not present

## 2016-11-23 DIAGNOSIS — E7800 Pure hypercholesterolemia, unspecified: Secondary | ICD-10-CM | POA: Diagnosis not present

## 2016-11-24 DIAGNOSIS — I10 Essential (primary) hypertension: Secondary | ICD-10-CM | POA: Diagnosis not present

## 2016-11-24 DIAGNOSIS — Z9181 History of falling: Secondary | ICD-10-CM | POA: Diagnosis not present

## 2016-11-24 DIAGNOSIS — W19XXXD Unspecified fall, subsequent encounter: Secondary | ICD-10-CM | POA: Diagnosis not present

## 2016-11-24 DIAGNOSIS — Z7982 Long term (current) use of aspirin: Secondary | ICD-10-CM | POA: Diagnosis not present

## 2016-11-24 DIAGNOSIS — E119 Type 2 diabetes mellitus without complications: Secondary | ICD-10-CM | POA: Diagnosis not present

## 2016-11-24 DIAGNOSIS — I2510 Atherosclerotic heart disease of native coronary artery without angina pectoris: Secondary | ICD-10-CM | POA: Diagnosis not present

## 2016-11-24 DIAGNOSIS — K219 Gastro-esophageal reflux disease without esophagitis: Secondary | ICD-10-CM | POA: Diagnosis not present

## 2016-11-24 DIAGNOSIS — E7800 Pure hypercholesterolemia, unspecified: Secondary | ICD-10-CM | POA: Diagnosis not present

## 2016-11-24 DIAGNOSIS — S82841D Displaced bimalleolar fracture of right lower leg, subsequent encounter for closed fracture with routine healing: Secondary | ICD-10-CM | POA: Diagnosis not present

## 2016-11-25 DIAGNOSIS — S81801A Unspecified open wound, right lower leg, initial encounter: Secondary | ICD-10-CM | POA: Diagnosis not present

## 2016-11-25 DIAGNOSIS — E119 Type 2 diabetes mellitus without complications: Secondary | ICD-10-CM | POA: Diagnosis not present

## 2016-11-25 DIAGNOSIS — W19XXXD Unspecified fall, subsequent encounter: Secondary | ICD-10-CM | POA: Diagnosis not present

## 2016-11-25 DIAGNOSIS — K219 Gastro-esophageal reflux disease without esophagitis: Secondary | ICD-10-CM | POA: Diagnosis not present

## 2016-11-25 DIAGNOSIS — S82841D Displaced bimalleolar fracture of right lower leg, subsequent encounter for closed fracture with routine healing: Secondary | ICD-10-CM | POA: Diagnosis not present

## 2016-11-25 DIAGNOSIS — E7800 Pure hypercholesterolemia, unspecified: Secondary | ICD-10-CM | POA: Diagnosis not present

## 2016-11-25 DIAGNOSIS — I10 Essential (primary) hypertension: Secondary | ICD-10-CM | POA: Diagnosis not present

## 2016-11-25 DIAGNOSIS — Z9181 History of falling: Secondary | ICD-10-CM | POA: Diagnosis not present

## 2016-11-25 DIAGNOSIS — Z7982 Long term (current) use of aspirin: Secondary | ICD-10-CM | POA: Diagnosis not present

## 2016-11-25 DIAGNOSIS — I2510 Atherosclerotic heart disease of native coronary artery without angina pectoris: Secondary | ICD-10-CM | POA: Diagnosis not present

## 2016-11-25 DIAGNOSIS — L97312 Non-pressure chronic ulcer of right ankle with fat layer exposed: Secondary | ICD-10-CM | POA: Diagnosis not present

## 2016-11-26 DIAGNOSIS — Z7982 Long term (current) use of aspirin: Secondary | ICD-10-CM | POA: Diagnosis not present

## 2016-11-26 DIAGNOSIS — S82841D Displaced bimalleolar fracture of right lower leg, subsequent encounter for closed fracture with routine healing: Secondary | ICD-10-CM | POA: Diagnosis not present

## 2016-11-26 DIAGNOSIS — I2510 Atherosclerotic heart disease of native coronary artery without angina pectoris: Secondary | ICD-10-CM | POA: Diagnosis not present

## 2016-11-26 DIAGNOSIS — Z9181 History of falling: Secondary | ICD-10-CM | POA: Diagnosis not present

## 2016-11-26 DIAGNOSIS — W19XXXD Unspecified fall, subsequent encounter: Secondary | ICD-10-CM | POA: Diagnosis not present

## 2016-11-26 DIAGNOSIS — K219 Gastro-esophageal reflux disease without esophagitis: Secondary | ICD-10-CM | POA: Diagnosis not present

## 2016-11-26 DIAGNOSIS — I10 Essential (primary) hypertension: Secondary | ICD-10-CM | POA: Diagnosis not present

## 2016-11-26 DIAGNOSIS — E119 Type 2 diabetes mellitus without complications: Secondary | ICD-10-CM | POA: Diagnosis not present

## 2016-11-26 DIAGNOSIS — E7800 Pure hypercholesterolemia, unspecified: Secondary | ICD-10-CM | POA: Diagnosis not present

## 2016-11-27 DIAGNOSIS — R262 Difficulty in walking, not elsewhere classified: Secondary | ICD-10-CM | POA: Diagnosis not present

## 2016-11-27 DIAGNOSIS — E119 Type 2 diabetes mellitus without complications: Secondary | ICD-10-CM | POA: Diagnosis not present

## 2016-11-27 DIAGNOSIS — M6281 Muscle weakness (generalized): Secondary | ICD-10-CM | POA: Diagnosis not present

## 2016-11-27 DIAGNOSIS — I2510 Atherosclerotic heart disease of native coronary artery without angina pectoris: Secondary | ICD-10-CM | POA: Diagnosis not present

## 2016-11-27 DIAGNOSIS — S82841D Displaced bimalleolar fracture of right lower leg, subsequent encounter for closed fracture with routine healing: Secondary | ICD-10-CM | POA: Diagnosis not present

## 2016-11-27 DIAGNOSIS — K219 Gastro-esophageal reflux disease without esophagitis: Secondary | ICD-10-CM | POA: Diagnosis not present

## 2016-11-27 DIAGNOSIS — W19XXXD Unspecified fall, subsequent encounter: Secondary | ICD-10-CM | POA: Diagnosis not present

## 2016-11-27 DIAGNOSIS — E7800 Pure hypercholesterolemia, unspecified: Secondary | ICD-10-CM | POA: Diagnosis not present

## 2016-11-27 DIAGNOSIS — I10 Essential (primary) hypertension: Secondary | ICD-10-CM | POA: Diagnosis not present

## 2016-11-27 DIAGNOSIS — Z9181 History of falling: Secondary | ICD-10-CM | POA: Diagnosis not present

## 2016-11-27 DIAGNOSIS — Z7982 Long term (current) use of aspirin: Secondary | ICD-10-CM | POA: Diagnosis not present

## 2016-11-28 DIAGNOSIS — Z7982 Long term (current) use of aspirin: Secondary | ICD-10-CM | POA: Diagnosis not present

## 2016-11-28 DIAGNOSIS — Z9181 History of falling: Secondary | ICD-10-CM | POA: Diagnosis not present

## 2016-11-28 DIAGNOSIS — I10 Essential (primary) hypertension: Secondary | ICD-10-CM | POA: Diagnosis not present

## 2016-11-28 DIAGNOSIS — I2510 Atherosclerotic heart disease of native coronary artery without angina pectoris: Secondary | ICD-10-CM | POA: Diagnosis not present

## 2016-11-28 DIAGNOSIS — S82841D Displaced bimalleolar fracture of right lower leg, subsequent encounter for closed fracture with routine healing: Secondary | ICD-10-CM | POA: Diagnosis not present

## 2016-11-28 DIAGNOSIS — K219 Gastro-esophageal reflux disease without esophagitis: Secondary | ICD-10-CM | POA: Diagnosis not present

## 2016-11-28 DIAGNOSIS — E119 Type 2 diabetes mellitus without complications: Secondary | ICD-10-CM | POA: Diagnosis not present

## 2016-11-28 DIAGNOSIS — W19XXXD Unspecified fall, subsequent encounter: Secondary | ICD-10-CM | POA: Diagnosis not present

## 2016-11-28 DIAGNOSIS — E7800 Pure hypercholesterolemia, unspecified: Secondary | ICD-10-CM | POA: Diagnosis not present

## 2016-11-29 DIAGNOSIS — I10 Essential (primary) hypertension: Secondary | ICD-10-CM | POA: Diagnosis not present

## 2016-11-29 DIAGNOSIS — Z9181 History of falling: Secondary | ICD-10-CM | POA: Diagnosis not present

## 2016-11-29 DIAGNOSIS — W19XXXD Unspecified fall, subsequent encounter: Secondary | ICD-10-CM | POA: Diagnosis not present

## 2016-11-29 DIAGNOSIS — S82841D Displaced bimalleolar fracture of right lower leg, subsequent encounter for closed fracture with routine healing: Secondary | ICD-10-CM | POA: Diagnosis not present

## 2016-11-29 DIAGNOSIS — E7800 Pure hypercholesterolemia, unspecified: Secondary | ICD-10-CM | POA: Diagnosis not present

## 2016-11-29 DIAGNOSIS — Z7982 Long term (current) use of aspirin: Secondary | ICD-10-CM | POA: Diagnosis not present

## 2016-11-29 DIAGNOSIS — E119 Type 2 diabetes mellitus without complications: Secondary | ICD-10-CM | POA: Diagnosis not present

## 2016-11-29 DIAGNOSIS — I2510 Atherosclerotic heart disease of native coronary artery without angina pectoris: Secondary | ICD-10-CM | POA: Diagnosis not present

## 2016-11-29 DIAGNOSIS — K219 Gastro-esophageal reflux disease without esophagitis: Secondary | ICD-10-CM | POA: Diagnosis not present

## 2016-11-30 DIAGNOSIS — W19XXXD Unspecified fall, subsequent encounter: Secondary | ICD-10-CM | POA: Diagnosis not present

## 2016-11-30 DIAGNOSIS — I2510 Atherosclerotic heart disease of native coronary artery without angina pectoris: Secondary | ICD-10-CM | POA: Diagnosis not present

## 2016-11-30 DIAGNOSIS — Z9181 History of falling: Secondary | ICD-10-CM | POA: Diagnosis not present

## 2016-11-30 DIAGNOSIS — E7800 Pure hypercholesterolemia, unspecified: Secondary | ICD-10-CM | POA: Diagnosis not present

## 2016-11-30 DIAGNOSIS — I10 Essential (primary) hypertension: Secondary | ICD-10-CM | POA: Diagnosis not present

## 2016-11-30 DIAGNOSIS — E119 Type 2 diabetes mellitus without complications: Secondary | ICD-10-CM | POA: Diagnosis not present

## 2016-11-30 DIAGNOSIS — Z7982 Long term (current) use of aspirin: Secondary | ICD-10-CM | POA: Diagnosis not present

## 2016-11-30 DIAGNOSIS — S82841D Displaced bimalleolar fracture of right lower leg, subsequent encounter for closed fracture with routine healing: Secondary | ICD-10-CM | POA: Diagnosis not present

## 2016-11-30 DIAGNOSIS — K219 Gastro-esophageal reflux disease without esophagitis: Secondary | ICD-10-CM | POA: Diagnosis not present

## 2016-12-01 DIAGNOSIS — I10 Essential (primary) hypertension: Secondary | ICD-10-CM | POA: Diagnosis not present

## 2016-12-01 DIAGNOSIS — E119 Type 2 diabetes mellitus without complications: Secondary | ICD-10-CM | POA: Diagnosis not present

## 2016-12-01 DIAGNOSIS — E7800 Pure hypercholesterolemia, unspecified: Secondary | ICD-10-CM | POA: Diagnosis not present

## 2016-12-01 DIAGNOSIS — I2510 Atherosclerotic heart disease of native coronary artery without angina pectoris: Secondary | ICD-10-CM | POA: Diagnosis not present

## 2016-12-01 DIAGNOSIS — W19XXXD Unspecified fall, subsequent encounter: Secondary | ICD-10-CM | POA: Diagnosis not present

## 2016-12-01 DIAGNOSIS — K219 Gastro-esophageal reflux disease without esophagitis: Secondary | ICD-10-CM | POA: Diagnosis not present

## 2016-12-01 DIAGNOSIS — Z9181 History of falling: Secondary | ICD-10-CM | POA: Diagnosis not present

## 2016-12-01 DIAGNOSIS — S82841D Displaced bimalleolar fracture of right lower leg, subsequent encounter for closed fracture with routine healing: Secondary | ICD-10-CM | POA: Diagnosis not present

## 2016-12-01 DIAGNOSIS — Z7982 Long term (current) use of aspirin: Secondary | ICD-10-CM | POA: Diagnosis not present

## 2016-12-02 DIAGNOSIS — I10 Essential (primary) hypertension: Secondary | ICD-10-CM | POA: Diagnosis not present

## 2016-12-02 DIAGNOSIS — K219 Gastro-esophageal reflux disease without esophagitis: Secondary | ICD-10-CM | POA: Diagnosis not present

## 2016-12-02 DIAGNOSIS — Z7982 Long term (current) use of aspirin: Secondary | ICD-10-CM | POA: Diagnosis not present

## 2016-12-02 DIAGNOSIS — I2510 Atherosclerotic heart disease of native coronary artery without angina pectoris: Secondary | ICD-10-CM | POA: Diagnosis not present

## 2016-12-02 DIAGNOSIS — Z9181 History of falling: Secondary | ICD-10-CM | POA: Diagnosis not present

## 2016-12-02 DIAGNOSIS — S82841D Displaced bimalleolar fracture of right lower leg, subsequent encounter for closed fracture with routine healing: Secondary | ICD-10-CM | POA: Diagnosis not present

## 2016-12-02 DIAGNOSIS — E119 Type 2 diabetes mellitus without complications: Secondary | ICD-10-CM | POA: Diagnosis not present

## 2016-12-02 DIAGNOSIS — W19XXXD Unspecified fall, subsequent encounter: Secondary | ICD-10-CM | POA: Diagnosis not present

## 2016-12-02 DIAGNOSIS — E7800 Pure hypercholesterolemia, unspecified: Secondary | ICD-10-CM | POA: Diagnosis not present

## 2016-12-03 DIAGNOSIS — Z9181 History of falling: Secondary | ICD-10-CM | POA: Diagnosis not present

## 2016-12-03 DIAGNOSIS — I10 Essential (primary) hypertension: Secondary | ICD-10-CM | POA: Diagnosis not present

## 2016-12-03 DIAGNOSIS — K219 Gastro-esophageal reflux disease without esophagitis: Secondary | ICD-10-CM | POA: Diagnosis not present

## 2016-12-03 DIAGNOSIS — W19XXXD Unspecified fall, subsequent encounter: Secondary | ICD-10-CM | POA: Diagnosis not present

## 2016-12-03 DIAGNOSIS — Z7982 Long term (current) use of aspirin: Secondary | ICD-10-CM | POA: Diagnosis not present

## 2016-12-03 DIAGNOSIS — E119 Type 2 diabetes mellitus without complications: Secondary | ICD-10-CM | POA: Diagnosis not present

## 2016-12-03 DIAGNOSIS — E7800 Pure hypercholesterolemia, unspecified: Secondary | ICD-10-CM | POA: Diagnosis not present

## 2016-12-03 DIAGNOSIS — I2510 Atherosclerotic heart disease of native coronary artery without angina pectoris: Secondary | ICD-10-CM | POA: Diagnosis not present

## 2016-12-03 DIAGNOSIS — S82841D Displaced bimalleolar fracture of right lower leg, subsequent encounter for closed fracture with routine healing: Secondary | ICD-10-CM | POA: Diagnosis not present

## 2016-12-04 DIAGNOSIS — S82841D Displaced bimalleolar fracture of right lower leg, subsequent encounter for closed fracture with routine healing: Secondary | ICD-10-CM | POA: Diagnosis not present

## 2016-12-04 DIAGNOSIS — E119 Type 2 diabetes mellitus without complications: Secondary | ICD-10-CM | POA: Diagnosis not present

## 2016-12-04 DIAGNOSIS — I10 Essential (primary) hypertension: Secondary | ICD-10-CM | POA: Diagnosis not present

## 2016-12-04 DIAGNOSIS — Z9181 History of falling: Secondary | ICD-10-CM | POA: Diagnosis not present

## 2016-12-04 DIAGNOSIS — I2510 Atherosclerotic heart disease of native coronary artery without angina pectoris: Secondary | ICD-10-CM | POA: Diagnosis not present

## 2016-12-04 DIAGNOSIS — E7800 Pure hypercholesterolemia, unspecified: Secondary | ICD-10-CM | POA: Diagnosis not present

## 2016-12-04 DIAGNOSIS — W19XXXD Unspecified fall, subsequent encounter: Secondary | ICD-10-CM | POA: Diagnosis not present

## 2016-12-04 DIAGNOSIS — Z7982 Long term (current) use of aspirin: Secondary | ICD-10-CM | POA: Diagnosis not present

## 2016-12-04 DIAGNOSIS — K219 Gastro-esophageal reflux disease without esophagitis: Secondary | ICD-10-CM | POA: Diagnosis not present

## 2016-12-05 DIAGNOSIS — W19XXXD Unspecified fall, subsequent encounter: Secondary | ICD-10-CM | POA: Diagnosis not present

## 2016-12-05 DIAGNOSIS — K219 Gastro-esophageal reflux disease without esophagitis: Secondary | ICD-10-CM | POA: Diagnosis not present

## 2016-12-05 DIAGNOSIS — E119 Type 2 diabetes mellitus without complications: Secondary | ICD-10-CM | POA: Diagnosis not present

## 2016-12-05 DIAGNOSIS — I2510 Atherosclerotic heart disease of native coronary artery without angina pectoris: Secondary | ICD-10-CM | POA: Diagnosis not present

## 2016-12-05 DIAGNOSIS — I10 Essential (primary) hypertension: Secondary | ICD-10-CM | POA: Diagnosis not present

## 2016-12-05 DIAGNOSIS — Z7982 Long term (current) use of aspirin: Secondary | ICD-10-CM | POA: Diagnosis not present

## 2016-12-05 DIAGNOSIS — Z9181 History of falling: Secondary | ICD-10-CM | POA: Diagnosis not present

## 2016-12-05 DIAGNOSIS — S82841D Displaced bimalleolar fracture of right lower leg, subsequent encounter for closed fracture with routine healing: Secondary | ICD-10-CM | POA: Diagnosis not present

## 2016-12-05 DIAGNOSIS — E7800 Pure hypercholesterolemia, unspecified: Secondary | ICD-10-CM | POA: Diagnosis not present

## 2016-12-08 DIAGNOSIS — I10 Essential (primary) hypertension: Secondary | ICD-10-CM | POA: Diagnosis not present

## 2016-12-08 DIAGNOSIS — K219 Gastro-esophageal reflux disease without esophagitis: Secondary | ICD-10-CM | POA: Diagnosis not present

## 2016-12-08 DIAGNOSIS — E7800 Pure hypercholesterolemia, unspecified: Secondary | ICD-10-CM | POA: Diagnosis not present

## 2016-12-08 DIAGNOSIS — W19XXXD Unspecified fall, subsequent encounter: Secondary | ICD-10-CM | POA: Diagnosis not present

## 2016-12-08 DIAGNOSIS — Z9181 History of falling: Secondary | ICD-10-CM | POA: Diagnosis not present

## 2016-12-08 DIAGNOSIS — Z7982 Long term (current) use of aspirin: Secondary | ICD-10-CM | POA: Diagnosis not present

## 2016-12-08 DIAGNOSIS — S82841D Displaced bimalleolar fracture of right lower leg, subsequent encounter for closed fracture with routine healing: Secondary | ICD-10-CM | POA: Diagnosis not present

## 2016-12-08 DIAGNOSIS — I2510 Atherosclerotic heart disease of native coronary artery without angina pectoris: Secondary | ICD-10-CM | POA: Diagnosis not present

## 2016-12-08 DIAGNOSIS — E119 Type 2 diabetes mellitus without complications: Secondary | ICD-10-CM | POA: Diagnosis not present

## 2016-12-09 DIAGNOSIS — K219 Gastro-esophageal reflux disease without esophagitis: Secondary | ICD-10-CM | POA: Diagnosis not present

## 2016-12-09 DIAGNOSIS — S82841D Displaced bimalleolar fracture of right lower leg, subsequent encounter for closed fracture with routine healing: Secondary | ICD-10-CM | POA: Diagnosis not present

## 2016-12-09 DIAGNOSIS — E7800 Pure hypercholesterolemia, unspecified: Secondary | ICD-10-CM | POA: Diagnosis not present

## 2016-12-09 DIAGNOSIS — I10 Essential (primary) hypertension: Secondary | ICD-10-CM | POA: Diagnosis not present

## 2016-12-09 DIAGNOSIS — E119 Type 2 diabetes mellitus without complications: Secondary | ICD-10-CM | POA: Diagnosis not present

## 2016-12-09 DIAGNOSIS — I2510 Atherosclerotic heart disease of native coronary artery without angina pectoris: Secondary | ICD-10-CM | POA: Diagnosis not present

## 2016-12-09 DIAGNOSIS — Z9181 History of falling: Secondary | ICD-10-CM | POA: Diagnosis not present

## 2016-12-09 DIAGNOSIS — Z7982 Long term (current) use of aspirin: Secondary | ICD-10-CM | POA: Diagnosis not present

## 2016-12-09 DIAGNOSIS — W19XXXD Unspecified fall, subsequent encounter: Secondary | ICD-10-CM | POA: Diagnosis not present

## 2016-12-10 DIAGNOSIS — I10 Essential (primary) hypertension: Secondary | ICD-10-CM | POA: Diagnosis not present

## 2016-12-10 DIAGNOSIS — E119 Type 2 diabetes mellitus without complications: Secondary | ICD-10-CM | POA: Diagnosis not present

## 2016-12-10 DIAGNOSIS — I2510 Atherosclerotic heart disease of native coronary artery without angina pectoris: Secondary | ICD-10-CM | POA: Diagnosis not present

## 2016-12-10 DIAGNOSIS — Z9181 History of falling: Secondary | ICD-10-CM | POA: Diagnosis not present

## 2016-12-10 DIAGNOSIS — W19XXXD Unspecified fall, subsequent encounter: Secondary | ICD-10-CM | POA: Diagnosis not present

## 2016-12-10 DIAGNOSIS — S82841D Displaced bimalleolar fracture of right lower leg, subsequent encounter for closed fracture with routine healing: Secondary | ICD-10-CM | POA: Diagnosis not present

## 2016-12-10 DIAGNOSIS — Z7982 Long term (current) use of aspirin: Secondary | ICD-10-CM | POA: Diagnosis not present

## 2016-12-10 DIAGNOSIS — K219 Gastro-esophageal reflux disease without esophagitis: Secondary | ICD-10-CM | POA: Diagnosis not present

## 2016-12-10 DIAGNOSIS — E7800 Pure hypercholesterolemia, unspecified: Secondary | ICD-10-CM | POA: Diagnosis not present

## 2016-12-11 DIAGNOSIS — W19XXXD Unspecified fall, subsequent encounter: Secondary | ICD-10-CM | POA: Diagnosis not present

## 2016-12-11 DIAGNOSIS — S82841D Displaced bimalleolar fracture of right lower leg, subsequent encounter for closed fracture with routine healing: Secondary | ICD-10-CM | POA: Diagnosis not present

## 2016-12-11 DIAGNOSIS — K219 Gastro-esophageal reflux disease without esophagitis: Secondary | ICD-10-CM | POA: Diagnosis not present

## 2016-12-11 DIAGNOSIS — E119 Type 2 diabetes mellitus without complications: Secondary | ICD-10-CM | POA: Diagnosis not present

## 2016-12-11 DIAGNOSIS — I10 Essential (primary) hypertension: Secondary | ICD-10-CM | POA: Diagnosis not present

## 2016-12-11 DIAGNOSIS — I2510 Atherosclerotic heart disease of native coronary artery without angina pectoris: Secondary | ICD-10-CM | POA: Diagnosis not present

## 2016-12-11 DIAGNOSIS — Z7982 Long term (current) use of aspirin: Secondary | ICD-10-CM | POA: Diagnosis not present

## 2016-12-11 DIAGNOSIS — Z9181 History of falling: Secondary | ICD-10-CM | POA: Diagnosis not present

## 2016-12-11 DIAGNOSIS — E7800 Pure hypercholesterolemia, unspecified: Secondary | ICD-10-CM | POA: Diagnosis not present

## 2016-12-12 DIAGNOSIS — S82841D Displaced bimalleolar fracture of right lower leg, subsequent encounter for closed fracture with routine healing: Secondary | ICD-10-CM | POA: Diagnosis not present

## 2016-12-12 DIAGNOSIS — I10 Essential (primary) hypertension: Secondary | ICD-10-CM | POA: Diagnosis not present

## 2016-12-12 DIAGNOSIS — E119 Type 2 diabetes mellitus without complications: Secondary | ICD-10-CM | POA: Diagnosis not present

## 2016-12-12 DIAGNOSIS — Z7982 Long term (current) use of aspirin: Secondary | ICD-10-CM | POA: Diagnosis not present

## 2016-12-12 DIAGNOSIS — E7800 Pure hypercholesterolemia, unspecified: Secondary | ICD-10-CM | POA: Diagnosis not present

## 2016-12-12 DIAGNOSIS — W19XXXD Unspecified fall, subsequent encounter: Secondary | ICD-10-CM | POA: Diagnosis not present

## 2016-12-12 DIAGNOSIS — K219 Gastro-esophageal reflux disease without esophagitis: Secondary | ICD-10-CM | POA: Diagnosis not present

## 2016-12-12 DIAGNOSIS — I2510 Atherosclerotic heart disease of native coronary artery without angina pectoris: Secondary | ICD-10-CM | POA: Diagnosis not present

## 2016-12-12 DIAGNOSIS — Z9181 History of falling: Secondary | ICD-10-CM | POA: Diagnosis not present

## 2016-12-13 DIAGNOSIS — K219 Gastro-esophageal reflux disease without esophagitis: Secondary | ICD-10-CM | POA: Diagnosis not present

## 2016-12-13 DIAGNOSIS — E7800 Pure hypercholesterolemia, unspecified: Secondary | ICD-10-CM | POA: Diagnosis not present

## 2016-12-13 DIAGNOSIS — S82841D Displaced bimalleolar fracture of right lower leg, subsequent encounter for closed fracture with routine healing: Secondary | ICD-10-CM | POA: Diagnosis not present

## 2016-12-13 DIAGNOSIS — W19XXXD Unspecified fall, subsequent encounter: Secondary | ICD-10-CM | POA: Diagnosis not present

## 2016-12-13 DIAGNOSIS — Z7982 Long term (current) use of aspirin: Secondary | ICD-10-CM | POA: Diagnosis not present

## 2016-12-13 DIAGNOSIS — I2510 Atherosclerotic heart disease of native coronary artery without angina pectoris: Secondary | ICD-10-CM | POA: Diagnosis not present

## 2016-12-13 DIAGNOSIS — Z9181 History of falling: Secondary | ICD-10-CM | POA: Diagnosis not present

## 2016-12-13 DIAGNOSIS — I10 Essential (primary) hypertension: Secondary | ICD-10-CM | POA: Diagnosis not present

## 2016-12-13 DIAGNOSIS — E119 Type 2 diabetes mellitus without complications: Secondary | ICD-10-CM | POA: Diagnosis not present

## 2016-12-14 DIAGNOSIS — E7800 Pure hypercholesterolemia, unspecified: Secondary | ICD-10-CM | POA: Diagnosis not present

## 2016-12-14 DIAGNOSIS — Z7982 Long term (current) use of aspirin: Secondary | ICD-10-CM | POA: Diagnosis not present

## 2016-12-14 DIAGNOSIS — I10 Essential (primary) hypertension: Secondary | ICD-10-CM | POA: Diagnosis not present

## 2016-12-14 DIAGNOSIS — I2510 Atherosclerotic heart disease of native coronary artery without angina pectoris: Secondary | ICD-10-CM | POA: Diagnosis not present

## 2016-12-14 DIAGNOSIS — K219 Gastro-esophageal reflux disease without esophagitis: Secondary | ICD-10-CM | POA: Diagnosis not present

## 2016-12-14 DIAGNOSIS — Z9181 History of falling: Secondary | ICD-10-CM | POA: Diagnosis not present

## 2016-12-14 DIAGNOSIS — E119 Type 2 diabetes mellitus without complications: Secondary | ICD-10-CM | POA: Diagnosis not present

## 2016-12-14 DIAGNOSIS — W19XXXD Unspecified fall, subsequent encounter: Secondary | ICD-10-CM | POA: Diagnosis not present

## 2016-12-14 DIAGNOSIS — S82841D Displaced bimalleolar fracture of right lower leg, subsequent encounter for closed fracture with routine healing: Secondary | ICD-10-CM | POA: Diagnosis not present

## 2016-12-15 DIAGNOSIS — S82841A Displaced bimalleolar fracture of right lower leg, initial encounter for closed fracture: Secondary | ICD-10-CM | POA: Diagnosis not present

## 2016-12-16 DIAGNOSIS — Z7982 Long term (current) use of aspirin: Secondary | ICD-10-CM | POA: Diagnosis not present

## 2016-12-16 DIAGNOSIS — I10 Essential (primary) hypertension: Secondary | ICD-10-CM | POA: Diagnosis not present

## 2016-12-16 DIAGNOSIS — E119 Type 2 diabetes mellitus without complications: Secondary | ICD-10-CM | POA: Diagnosis not present

## 2016-12-16 DIAGNOSIS — I2510 Atherosclerotic heart disease of native coronary artery without angina pectoris: Secondary | ICD-10-CM | POA: Diagnosis not present

## 2016-12-16 DIAGNOSIS — E7800 Pure hypercholesterolemia, unspecified: Secondary | ICD-10-CM | POA: Diagnosis not present

## 2016-12-16 DIAGNOSIS — W19XXXD Unspecified fall, subsequent encounter: Secondary | ICD-10-CM | POA: Diagnosis not present

## 2016-12-16 DIAGNOSIS — K219 Gastro-esophageal reflux disease without esophagitis: Secondary | ICD-10-CM | POA: Diagnosis not present

## 2016-12-16 DIAGNOSIS — Z9181 History of falling: Secondary | ICD-10-CM | POA: Diagnosis not present

## 2016-12-16 DIAGNOSIS — S82841D Displaced bimalleolar fracture of right lower leg, subsequent encounter for closed fracture with routine healing: Secondary | ICD-10-CM | POA: Diagnosis not present

## 2016-12-17 DIAGNOSIS — S82841D Displaced bimalleolar fracture of right lower leg, subsequent encounter for closed fracture with routine healing: Secondary | ICD-10-CM | POA: Diagnosis not present

## 2016-12-17 DIAGNOSIS — Z7982 Long term (current) use of aspirin: Secondary | ICD-10-CM | POA: Diagnosis not present

## 2016-12-17 DIAGNOSIS — Z4889 Encounter for other specified surgical aftercare: Secondary | ICD-10-CM | POA: Diagnosis not present

## 2016-12-17 DIAGNOSIS — E119 Type 2 diabetes mellitus without complications: Secondary | ICD-10-CM | POA: Diagnosis not present

## 2016-12-17 DIAGNOSIS — S81801A Unspecified open wound, right lower leg, initial encounter: Secondary | ICD-10-CM | POA: Diagnosis not present

## 2016-12-17 DIAGNOSIS — K219 Gastro-esophageal reflux disease without esophagitis: Secondary | ICD-10-CM | POA: Diagnosis not present

## 2016-12-17 DIAGNOSIS — E7800 Pure hypercholesterolemia, unspecified: Secondary | ICD-10-CM | POA: Diagnosis not present

## 2016-12-17 DIAGNOSIS — I2510 Atherosclerotic heart disease of native coronary artery without angina pectoris: Secondary | ICD-10-CM | POA: Diagnosis not present

## 2016-12-17 DIAGNOSIS — W19XXXD Unspecified fall, subsequent encounter: Secondary | ICD-10-CM | POA: Diagnosis not present

## 2016-12-17 DIAGNOSIS — I10 Essential (primary) hypertension: Secondary | ICD-10-CM | POA: Diagnosis not present

## 2016-12-17 DIAGNOSIS — Z9181 History of falling: Secondary | ICD-10-CM | POA: Diagnosis not present

## 2016-12-24 DIAGNOSIS — Z7982 Long term (current) use of aspirin: Secondary | ICD-10-CM | POA: Diagnosis not present

## 2016-12-24 DIAGNOSIS — I10 Essential (primary) hypertension: Secondary | ICD-10-CM | POA: Diagnosis not present

## 2016-12-24 DIAGNOSIS — E119 Type 2 diabetes mellitus without complications: Secondary | ICD-10-CM | POA: Diagnosis not present

## 2016-12-24 DIAGNOSIS — W19XXXD Unspecified fall, subsequent encounter: Secondary | ICD-10-CM | POA: Diagnosis not present

## 2016-12-24 DIAGNOSIS — S82841D Displaced bimalleolar fracture of right lower leg, subsequent encounter for closed fracture with routine healing: Secondary | ICD-10-CM | POA: Diagnosis not present

## 2016-12-24 DIAGNOSIS — E7800 Pure hypercholesterolemia, unspecified: Secondary | ICD-10-CM | POA: Diagnosis not present

## 2016-12-24 DIAGNOSIS — S81801A Unspecified open wound, right lower leg, initial encounter: Secondary | ICD-10-CM | POA: Diagnosis not present

## 2016-12-24 DIAGNOSIS — Z9181 History of falling: Secondary | ICD-10-CM | POA: Diagnosis not present

## 2016-12-24 DIAGNOSIS — K219 Gastro-esophageal reflux disease without esophagitis: Secondary | ICD-10-CM | POA: Diagnosis not present

## 2016-12-24 DIAGNOSIS — I2510 Atherosclerotic heart disease of native coronary artery without angina pectoris: Secondary | ICD-10-CM | POA: Diagnosis not present

## 2016-12-24 DIAGNOSIS — Z4889 Encounter for other specified surgical aftercare: Secondary | ICD-10-CM | POA: Diagnosis not present

## 2016-12-28 DIAGNOSIS — I10 Essential (primary) hypertension: Secondary | ICD-10-CM | POA: Diagnosis not present

## 2016-12-28 DIAGNOSIS — E119 Type 2 diabetes mellitus without complications: Secondary | ICD-10-CM | POA: Diagnosis not present

## 2016-12-28 DIAGNOSIS — Z9181 History of falling: Secondary | ICD-10-CM | POA: Diagnosis not present

## 2016-12-28 DIAGNOSIS — S82841D Displaced bimalleolar fracture of right lower leg, subsequent encounter for closed fracture with routine healing: Secondary | ICD-10-CM | POA: Diagnosis not present

## 2016-12-28 DIAGNOSIS — I2510 Atherosclerotic heart disease of native coronary artery without angina pectoris: Secondary | ICD-10-CM | POA: Diagnosis not present

## 2016-12-28 DIAGNOSIS — E7800 Pure hypercholesterolemia, unspecified: Secondary | ICD-10-CM | POA: Diagnosis not present

## 2016-12-28 DIAGNOSIS — R262 Difficulty in walking, not elsewhere classified: Secondary | ICD-10-CM | POA: Diagnosis not present

## 2016-12-28 DIAGNOSIS — M6281 Muscle weakness (generalized): Secondary | ICD-10-CM | POA: Diagnosis not present

## 2016-12-28 DIAGNOSIS — Z7982 Long term (current) use of aspirin: Secondary | ICD-10-CM | POA: Diagnosis not present

## 2016-12-28 DIAGNOSIS — W19XXXD Unspecified fall, subsequent encounter: Secondary | ICD-10-CM | POA: Diagnosis not present

## 2016-12-28 DIAGNOSIS — S81801A Unspecified open wound, right lower leg, initial encounter: Secondary | ICD-10-CM | POA: Diagnosis not present

## 2016-12-28 DIAGNOSIS — Z4889 Encounter for other specified surgical aftercare: Secondary | ICD-10-CM | POA: Diagnosis not present

## 2016-12-28 DIAGNOSIS — K219 Gastro-esophageal reflux disease without esophagitis: Secondary | ICD-10-CM | POA: Diagnosis not present

## 2016-12-31 DIAGNOSIS — E7800 Pure hypercholesterolemia, unspecified: Secondary | ICD-10-CM | POA: Diagnosis not present

## 2016-12-31 DIAGNOSIS — I2510 Atherosclerotic heart disease of native coronary artery without angina pectoris: Secondary | ICD-10-CM | POA: Diagnosis not present

## 2016-12-31 DIAGNOSIS — Z4889 Encounter for other specified surgical aftercare: Secondary | ICD-10-CM | POA: Diagnosis not present

## 2016-12-31 DIAGNOSIS — W19XXXD Unspecified fall, subsequent encounter: Secondary | ICD-10-CM | POA: Diagnosis not present

## 2016-12-31 DIAGNOSIS — I10 Essential (primary) hypertension: Secondary | ICD-10-CM | POA: Diagnosis not present

## 2016-12-31 DIAGNOSIS — S82841D Displaced bimalleolar fracture of right lower leg, subsequent encounter for closed fracture with routine healing: Secondary | ICD-10-CM | POA: Diagnosis not present

## 2016-12-31 DIAGNOSIS — E119 Type 2 diabetes mellitus without complications: Secondary | ICD-10-CM | POA: Diagnosis not present

## 2016-12-31 DIAGNOSIS — Z9181 History of falling: Secondary | ICD-10-CM | POA: Diagnosis not present

## 2016-12-31 DIAGNOSIS — K219 Gastro-esophageal reflux disease without esophagitis: Secondary | ICD-10-CM | POA: Diagnosis not present

## 2016-12-31 DIAGNOSIS — S81801A Unspecified open wound, right lower leg, initial encounter: Secondary | ICD-10-CM | POA: Diagnosis not present

## 2016-12-31 DIAGNOSIS — Z7982 Long term (current) use of aspirin: Secondary | ICD-10-CM | POA: Diagnosis not present

## 2017-01-04 DIAGNOSIS — K219 Gastro-esophageal reflux disease without esophagitis: Secondary | ICD-10-CM | POA: Diagnosis not present

## 2017-01-04 DIAGNOSIS — W19XXXD Unspecified fall, subsequent encounter: Secondary | ICD-10-CM | POA: Diagnosis not present

## 2017-01-04 DIAGNOSIS — Z4889 Encounter for other specified surgical aftercare: Secondary | ICD-10-CM | POA: Diagnosis not present

## 2017-01-04 DIAGNOSIS — Z9181 History of falling: Secondary | ICD-10-CM | POA: Diagnosis not present

## 2017-01-04 DIAGNOSIS — Z7982 Long term (current) use of aspirin: Secondary | ICD-10-CM | POA: Diagnosis not present

## 2017-01-04 DIAGNOSIS — S81801A Unspecified open wound, right lower leg, initial encounter: Secondary | ICD-10-CM | POA: Diagnosis not present

## 2017-01-04 DIAGNOSIS — E7800 Pure hypercholesterolemia, unspecified: Secondary | ICD-10-CM | POA: Diagnosis not present

## 2017-01-04 DIAGNOSIS — S82841D Displaced bimalleolar fracture of right lower leg, subsequent encounter for closed fracture with routine healing: Secondary | ICD-10-CM | POA: Diagnosis not present

## 2017-01-04 DIAGNOSIS — I10 Essential (primary) hypertension: Secondary | ICD-10-CM | POA: Diagnosis not present

## 2017-01-04 DIAGNOSIS — I2510 Atherosclerotic heart disease of native coronary artery without angina pectoris: Secondary | ICD-10-CM | POA: Diagnosis not present

## 2017-01-04 DIAGNOSIS — E119 Type 2 diabetes mellitus without complications: Secondary | ICD-10-CM | POA: Diagnosis not present

## 2017-01-05 DIAGNOSIS — I10 Essential (primary) hypertension: Secondary | ICD-10-CM | POA: Diagnosis not present

## 2017-01-05 DIAGNOSIS — E782 Mixed hyperlipidemia: Secondary | ICD-10-CM | POA: Diagnosis not present

## 2017-01-05 DIAGNOSIS — I2510 Atherosclerotic heart disease of native coronary artery without angina pectoris: Secondary | ICD-10-CM | POA: Diagnosis not present

## 2017-01-05 DIAGNOSIS — D638 Anemia in other chronic diseases classified elsewhere: Secondary | ICD-10-CM | POA: Diagnosis not present

## 2017-01-05 DIAGNOSIS — E538 Deficiency of other specified B group vitamins: Secondary | ICD-10-CM | POA: Diagnosis not present

## 2017-01-05 DIAGNOSIS — E119 Type 2 diabetes mellitus without complications: Secondary | ICD-10-CM | POA: Diagnosis not present

## 2017-01-08 DIAGNOSIS — E7800 Pure hypercholesterolemia, unspecified: Secondary | ICD-10-CM | POA: Diagnosis not present

## 2017-01-08 DIAGNOSIS — S82841D Displaced bimalleolar fracture of right lower leg, subsequent encounter for closed fracture with routine healing: Secondary | ICD-10-CM | POA: Diagnosis not present

## 2017-01-08 DIAGNOSIS — E119 Type 2 diabetes mellitus without complications: Secondary | ICD-10-CM | POA: Diagnosis not present

## 2017-01-08 DIAGNOSIS — Z7982 Long term (current) use of aspirin: Secondary | ICD-10-CM | POA: Diagnosis not present

## 2017-01-08 DIAGNOSIS — S81801A Unspecified open wound, right lower leg, initial encounter: Secondary | ICD-10-CM | POA: Diagnosis not present

## 2017-01-08 DIAGNOSIS — I10 Essential (primary) hypertension: Secondary | ICD-10-CM | POA: Diagnosis not present

## 2017-01-08 DIAGNOSIS — K219 Gastro-esophageal reflux disease without esophagitis: Secondary | ICD-10-CM | POA: Diagnosis not present

## 2017-01-08 DIAGNOSIS — Z9181 History of falling: Secondary | ICD-10-CM | POA: Diagnosis not present

## 2017-01-08 DIAGNOSIS — Z4889 Encounter for other specified surgical aftercare: Secondary | ICD-10-CM | POA: Diagnosis not present

## 2017-01-08 DIAGNOSIS — I2510 Atherosclerotic heart disease of native coronary artery without angina pectoris: Secondary | ICD-10-CM | POA: Diagnosis not present

## 2017-01-08 DIAGNOSIS — W19XXXD Unspecified fall, subsequent encounter: Secondary | ICD-10-CM | POA: Diagnosis not present

## 2017-01-11 DIAGNOSIS — E7800 Pure hypercholesterolemia, unspecified: Secondary | ICD-10-CM | POA: Diagnosis not present

## 2017-01-11 DIAGNOSIS — Z9181 History of falling: Secondary | ICD-10-CM | POA: Diagnosis not present

## 2017-01-11 DIAGNOSIS — Z4889 Encounter for other specified surgical aftercare: Secondary | ICD-10-CM | POA: Diagnosis not present

## 2017-01-11 DIAGNOSIS — S81801A Unspecified open wound, right lower leg, initial encounter: Secondary | ICD-10-CM | POA: Diagnosis not present

## 2017-01-11 DIAGNOSIS — E119 Type 2 diabetes mellitus without complications: Secondary | ICD-10-CM | POA: Diagnosis not present

## 2017-01-11 DIAGNOSIS — I10 Essential (primary) hypertension: Secondary | ICD-10-CM | POA: Diagnosis not present

## 2017-01-11 DIAGNOSIS — Z7982 Long term (current) use of aspirin: Secondary | ICD-10-CM | POA: Diagnosis not present

## 2017-01-11 DIAGNOSIS — S82841D Displaced bimalleolar fracture of right lower leg, subsequent encounter for closed fracture with routine healing: Secondary | ICD-10-CM | POA: Diagnosis not present

## 2017-01-11 DIAGNOSIS — W19XXXD Unspecified fall, subsequent encounter: Secondary | ICD-10-CM | POA: Diagnosis not present

## 2017-01-11 DIAGNOSIS — K219 Gastro-esophageal reflux disease without esophagitis: Secondary | ICD-10-CM | POA: Diagnosis not present

## 2017-01-11 DIAGNOSIS — I2510 Atherosclerotic heart disease of native coronary artery without angina pectoris: Secondary | ICD-10-CM | POA: Diagnosis not present

## 2017-01-14 DIAGNOSIS — E7800 Pure hypercholesterolemia, unspecified: Secondary | ICD-10-CM | POA: Diagnosis not present

## 2017-01-14 DIAGNOSIS — I2510 Atherosclerotic heart disease of native coronary artery without angina pectoris: Secondary | ICD-10-CM | POA: Diagnosis not present

## 2017-01-14 DIAGNOSIS — I10 Essential (primary) hypertension: Secondary | ICD-10-CM | POA: Diagnosis not present

## 2017-01-14 DIAGNOSIS — S82841D Displaced bimalleolar fracture of right lower leg, subsequent encounter for closed fracture with routine healing: Secondary | ICD-10-CM | POA: Diagnosis not present

## 2017-01-14 DIAGNOSIS — S81801A Unspecified open wound, right lower leg, initial encounter: Secondary | ICD-10-CM | POA: Diagnosis not present

## 2017-01-14 DIAGNOSIS — Z9181 History of falling: Secondary | ICD-10-CM | POA: Diagnosis not present

## 2017-01-14 DIAGNOSIS — W19XXXD Unspecified fall, subsequent encounter: Secondary | ICD-10-CM | POA: Diagnosis not present

## 2017-01-14 DIAGNOSIS — Z7982 Long term (current) use of aspirin: Secondary | ICD-10-CM | POA: Diagnosis not present

## 2017-01-14 DIAGNOSIS — E119 Type 2 diabetes mellitus without complications: Secondary | ICD-10-CM | POA: Diagnosis not present

## 2017-01-14 DIAGNOSIS — Z4889 Encounter for other specified surgical aftercare: Secondary | ICD-10-CM | POA: Diagnosis not present

## 2017-01-14 DIAGNOSIS — K219 Gastro-esophageal reflux disease without esophagitis: Secondary | ICD-10-CM | POA: Diagnosis not present

## 2017-01-18 DIAGNOSIS — E7800 Pure hypercholesterolemia, unspecified: Secondary | ICD-10-CM | POA: Diagnosis not present

## 2017-01-18 DIAGNOSIS — S82841D Displaced bimalleolar fracture of right lower leg, subsequent encounter for closed fracture with routine healing: Secondary | ICD-10-CM | POA: Diagnosis not present

## 2017-01-18 DIAGNOSIS — I2510 Atherosclerotic heart disease of native coronary artery without angina pectoris: Secondary | ICD-10-CM | POA: Diagnosis not present

## 2017-01-18 DIAGNOSIS — Z9181 History of falling: Secondary | ICD-10-CM | POA: Diagnosis not present

## 2017-01-18 DIAGNOSIS — I10 Essential (primary) hypertension: Secondary | ICD-10-CM | POA: Diagnosis not present

## 2017-01-18 DIAGNOSIS — K219 Gastro-esophageal reflux disease without esophagitis: Secondary | ICD-10-CM | POA: Diagnosis not present

## 2017-01-18 DIAGNOSIS — Z4889 Encounter for other specified surgical aftercare: Secondary | ICD-10-CM | POA: Diagnosis not present

## 2017-01-18 DIAGNOSIS — E119 Type 2 diabetes mellitus without complications: Secondary | ICD-10-CM | POA: Diagnosis not present

## 2017-01-18 DIAGNOSIS — Z7982 Long term (current) use of aspirin: Secondary | ICD-10-CM | POA: Diagnosis not present

## 2017-01-18 DIAGNOSIS — W19XXXD Unspecified fall, subsequent encounter: Secondary | ICD-10-CM | POA: Diagnosis not present

## 2017-01-18 DIAGNOSIS — S81801A Unspecified open wound, right lower leg, initial encounter: Secondary | ICD-10-CM | POA: Diagnosis not present

## 2017-01-21 DIAGNOSIS — Z7982 Long term (current) use of aspirin: Secondary | ICD-10-CM | POA: Diagnosis not present

## 2017-01-21 DIAGNOSIS — Z9181 History of falling: Secondary | ICD-10-CM | POA: Diagnosis not present

## 2017-01-21 DIAGNOSIS — S81801A Unspecified open wound, right lower leg, initial encounter: Secondary | ICD-10-CM | POA: Diagnosis not present

## 2017-01-21 DIAGNOSIS — Z4889 Encounter for other specified surgical aftercare: Secondary | ICD-10-CM | POA: Diagnosis not present

## 2017-01-21 DIAGNOSIS — E119 Type 2 diabetes mellitus without complications: Secondary | ICD-10-CM | POA: Diagnosis not present

## 2017-01-21 DIAGNOSIS — E7800 Pure hypercholesterolemia, unspecified: Secondary | ICD-10-CM | POA: Diagnosis not present

## 2017-01-21 DIAGNOSIS — I2510 Atherosclerotic heart disease of native coronary artery without angina pectoris: Secondary | ICD-10-CM | POA: Diagnosis not present

## 2017-01-21 DIAGNOSIS — K219 Gastro-esophageal reflux disease without esophagitis: Secondary | ICD-10-CM | POA: Diagnosis not present

## 2017-01-21 DIAGNOSIS — W19XXXD Unspecified fall, subsequent encounter: Secondary | ICD-10-CM | POA: Diagnosis not present

## 2017-01-21 DIAGNOSIS — S82841D Displaced bimalleolar fracture of right lower leg, subsequent encounter for closed fracture with routine healing: Secondary | ICD-10-CM | POA: Diagnosis not present

## 2017-01-21 DIAGNOSIS — I10 Essential (primary) hypertension: Secondary | ICD-10-CM | POA: Diagnosis not present

## 2017-01-27 DIAGNOSIS — E7800 Pure hypercholesterolemia, unspecified: Secondary | ICD-10-CM | POA: Diagnosis not present

## 2017-01-27 DIAGNOSIS — S81801A Unspecified open wound, right lower leg, initial encounter: Secondary | ICD-10-CM | POA: Diagnosis not present

## 2017-01-27 DIAGNOSIS — Z4889 Encounter for other specified surgical aftercare: Secondary | ICD-10-CM | POA: Diagnosis not present

## 2017-01-27 DIAGNOSIS — Z7982 Long term (current) use of aspirin: Secondary | ICD-10-CM | POA: Diagnosis not present

## 2017-01-27 DIAGNOSIS — M6281 Muscle weakness (generalized): Secondary | ICD-10-CM | POA: Diagnosis not present

## 2017-01-27 DIAGNOSIS — I2510 Atherosclerotic heart disease of native coronary artery without angina pectoris: Secondary | ICD-10-CM | POA: Diagnosis not present

## 2017-01-27 DIAGNOSIS — S82841D Displaced bimalleolar fracture of right lower leg, subsequent encounter for closed fracture with routine healing: Secondary | ICD-10-CM | POA: Diagnosis not present

## 2017-01-27 DIAGNOSIS — R262 Difficulty in walking, not elsewhere classified: Secondary | ICD-10-CM | POA: Diagnosis not present

## 2017-01-27 DIAGNOSIS — K219 Gastro-esophageal reflux disease without esophagitis: Secondary | ICD-10-CM | POA: Diagnosis not present

## 2017-01-27 DIAGNOSIS — Z9181 History of falling: Secondary | ICD-10-CM | POA: Diagnosis not present

## 2017-01-27 DIAGNOSIS — W19XXXD Unspecified fall, subsequent encounter: Secondary | ICD-10-CM | POA: Diagnosis not present

## 2017-01-27 DIAGNOSIS — E119 Type 2 diabetes mellitus without complications: Secondary | ICD-10-CM | POA: Diagnosis not present

## 2017-01-27 DIAGNOSIS — I10 Essential (primary) hypertension: Secondary | ICD-10-CM | POA: Diagnosis not present

## 2017-01-29 DIAGNOSIS — E7800 Pure hypercholesterolemia, unspecified: Secondary | ICD-10-CM | POA: Diagnosis not present

## 2017-01-29 DIAGNOSIS — K219 Gastro-esophageal reflux disease without esophagitis: Secondary | ICD-10-CM | POA: Diagnosis not present

## 2017-01-29 DIAGNOSIS — I2510 Atherosclerotic heart disease of native coronary artery without angina pectoris: Secondary | ICD-10-CM | POA: Diagnosis not present

## 2017-01-29 DIAGNOSIS — E119 Type 2 diabetes mellitus without complications: Secondary | ICD-10-CM | POA: Diagnosis not present

## 2017-01-29 DIAGNOSIS — W19XXXD Unspecified fall, subsequent encounter: Secondary | ICD-10-CM | POA: Diagnosis not present

## 2017-01-29 DIAGNOSIS — S81801A Unspecified open wound, right lower leg, initial encounter: Secondary | ICD-10-CM | POA: Diagnosis not present

## 2017-01-29 DIAGNOSIS — Z9181 History of falling: Secondary | ICD-10-CM | POA: Diagnosis not present

## 2017-01-29 DIAGNOSIS — Z7982 Long term (current) use of aspirin: Secondary | ICD-10-CM | POA: Diagnosis not present

## 2017-01-29 DIAGNOSIS — Z4889 Encounter for other specified surgical aftercare: Secondary | ICD-10-CM | POA: Diagnosis not present

## 2017-01-29 DIAGNOSIS — S82841D Displaced bimalleolar fracture of right lower leg, subsequent encounter for closed fracture with routine healing: Secondary | ICD-10-CM | POA: Diagnosis not present

## 2017-01-29 DIAGNOSIS — I10 Essential (primary) hypertension: Secondary | ICD-10-CM | POA: Diagnosis not present

## 2017-02-01 DIAGNOSIS — Z9181 History of falling: Secondary | ICD-10-CM | POA: Diagnosis not present

## 2017-02-01 DIAGNOSIS — Z4889 Encounter for other specified surgical aftercare: Secondary | ICD-10-CM | POA: Diagnosis not present

## 2017-02-01 DIAGNOSIS — E119 Type 2 diabetes mellitus without complications: Secondary | ICD-10-CM | POA: Diagnosis not present

## 2017-02-01 DIAGNOSIS — I2510 Atherosclerotic heart disease of native coronary artery without angina pectoris: Secondary | ICD-10-CM | POA: Diagnosis not present

## 2017-02-01 DIAGNOSIS — W19XXXD Unspecified fall, subsequent encounter: Secondary | ICD-10-CM | POA: Diagnosis not present

## 2017-02-01 DIAGNOSIS — E7800 Pure hypercholesterolemia, unspecified: Secondary | ICD-10-CM | POA: Diagnosis not present

## 2017-02-01 DIAGNOSIS — Z7982 Long term (current) use of aspirin: Secondary | ICD-10-CM | POA: Diagnosis not present

## 2017-02-01 DIAGNOSIS — K219 Gastro-esophageal reflux disease without esophagitis: Secondary | ICD-10-CM | POA: Diagnosis not present

## 2017-02-01 DIAGNOSIS — I10 Essential (primary) hypertension: Secondary | ICD-10-CM | POA: Diagnosis not present

## 2017-02-01 DIAGNOSIS — S82841D Displaced bimalleolar fracture of right lower leg, subsequent encounter for closed fracture with routine healing: Secondary | ICD-10-CM | POA: Diagnosis not present

## 2017-02-01 DIAGNOSIS — S81801A Unspecified open wound, right lower leg, initial encounter: Secondary | ICD-10-CM | POA: Diagnosis not present

## 2017-02-03 DIAGNOSIS — S82841A Displaced bimalleolar fracture of right lower leg, initial encounter for closed fracture: Secondary | ICD-10-CM | POA: Diagnosis not present

## 2017-02-04 DIAGNOSIS — I2510 Atherosclerotic heart disease of native coronary artery without angina pectoris: Secondary | ICD-10-CM | POA: Diagnosis not present

## 2017-02-04 DIAGNOSIS — I10 Essential (primary) hypertension: Secondary | ICD-10-CM | POA: Diagnosis not present

## 2017-02-04 DIAGNOSIS — Z4889 Encounter for other specified surgical aftercare: Secondary | ICD-10-CM | POA: Diagnosis not present

## 2017-02-04 DIAGNOSIS — Z9181 History of falling: Secondary | ICD-10-CM | POA: Diagnosis not present

## 2017-02-04 DIAGNOSIS — S82841D Displaced bimalleolar fracture of right lower leg, subsequent encounter for closed fracture with routine healing: Secondary | ICD-10-CM | POA: Diagnosis not present

## 2017-02-04 DIAGNOSIS — S81801A Unspecified open wound, right lower leg, initial encounter: Secondary | ICD-10-CM | POA: Diagnosis not present

## 2017-02-04 DIAGNOSIS — K219 Gastro-esophageal reflux disease without esophagitis: Secondary | ICD-10-CM | POA: Diagnosis not present

## 2017-02-04 DIAGNOSIS — E119 Type 2 diabetes mellitus without complications: Secondary | ICD-10-CM | POA: Diagnosis not present

## 2017-02-04 DIAGNOSIS — E7800 Pure hypercholesterolemia, unspecified: Secondary | ICD-10-CM | POA: Diagnosis not present

## 2017-02-04 DIAGNOSIS — W19XXXD Unspecified fall, subsequent encounter: Secondary | ICD-10-CM | POA: Diagnosis not present

## 2017-02-04 DIAGNOSIS — Z7982 Long term (current) use of aspirin: Secondary | ICD-10-CM | POA: Diagnosis not present

## 2017-02-27 DIAGNOSIS — S82841D Displaced bimalleolar fracture of right lower leg, subsequent encounter for closed fracture with routine healing: Secondary | ICD-10-CM | POA: Diagnosis not present

## 2017-02-27 DIAGNOSIS — M6281 Muscle weakness (generalized): Secondary | ICD-10-CM | POA: Diagnosis not present

## 2017-02-27 DIAGNOSIS — E119 Type 2 diabetes mellitus without complications: Secondary | ICD-10-CM | POA: Diagnosis not present

## 2017-02-27 DIAGNOSIS — R262 Difficulty in walking, not elsewhere classified: Secondary | ICD-10-CM | POA: Diagnosis not present

## 2017-03-30 DIAGNOSIS — S82841D Displaced bimalleolar fracture of right lower leg, subsequent encounter for closed fracture with routine healing: Secondary | ICD-10-CM | POA: Diagnosis not present

## 2017-03-30 DIAGNOSIS — E119 Type 2 diabetes mellitus without complications: Secondary | ICD-10-CM | POA: Diagnosis not present

## 2017-03-30 DIAGNOSIS — M6281 Muscle weakness (generalized): Secondary | ICD-10-CM | POA: Diagnosis not present

## 2017-03-30 DIAGNOSIS — R262 Difficulty in walking, not elsewhere classified: Secondary | ICD-10-CM | POA: Diagnosis not present

## 2017-03-31 DIAGNOSIS — S82841G Displaced bimalleolar fracture of right lower leg, subsequent encounter for closed fracture with delayed healing: Secondary | ICD-10-CM | POA: Diagnosis not present

## 2017-04-07 DIAGNOSIS — E782 Mixed hyperlipidemia: Secondary | ICD-10-CM | POA: Diagnosis not present

## 2017-04-07 DIAGNOSIS — E119 Type 2 diabetes mellitus without complications: Secondary | ICD-10-CM | POA: Diagnosis not present

## 2017-04-07 DIAGNOSIS — I10 Essential (primary) hypertension: Secondary | ICD-10-CM | POA: Diagnosis not present

## 2017-04-07 DIAGNOSIS — D519 Vitamin B12 deficiency anemia, unspecified: Secondary | ICD-10-CM | POA: Diagnosis not present

## 2017-04-07 DIAGNOSIS — Z6824 Body mass index (BMI) 24.0-24.9, adult: Secondary | ICD-10-CM | POA: Diagnosis not present

## 2017-04-07 DIAGNOSIS — Z79899 Other long term (current) drug therapy: Secondary | ICD-10-CM | POA: Diagnosis not present

## 2017-04-27 DIAGNOSIS — E119 Type 2 diabetes mellitus without complications: Secondary | ICD-10-CM | POA: Diagnosis not present

## 2017-04-27 DIAGNOSIS — M6281 Muscle weakness (generalized): Secondary | ICD-10-CM | POA: Diagnosis not present

## 2017-04-27 DIAGNOSIS — R262 Difficulty in walking, not elsewhere classified: Secondary | ICD-10-CM | POA: Diagnosis not present

## 2017-04-27 DIAGNOSIS — S82841D Displaced bimalleolar fracture of right lower leg, subsequent encounter for closed fracture with routine healing: Secondary | ICD-10-CM | POA: Diagnosis not present

## 2017-05-10 DIAGNOSIS — D519 Vitamin B12 deficiency anemia, unspecified: Secondary | ICD-10-CM | POA: Diagnosis not present

## 2017-05-28 DIAGNOSIS — S82841D Displaced bimalleolar fracture of right lower leg, subsequent encounter for closed fracture with routine healing: Secondary | ICD-10-CM | POA: Diagnosis not present

## 2017-05-28 DIAGNOSIS — R262 Difficulty in walking, not elsewhere classified: Secondary | ICD-10-CM | POA: Diagnosis not present

## 2017-05-28 DIAGNOSIS — E119 Type 2 diabetes mellitus without complications: Secondary | ICD-10-CM | POA: Diagnosis not present

## 2017-05-28 DIAGNOSIS — M6281 Muscle weakness (generalized): Secondary | ICD-10-CM | POA: Diagnosis not present

## 2017-06-10 DIAGNOSIS — D519 Vitamin B12 deficiency anemia, unspecified: Secondary | ICD-10-CM | POA: Diagnosis not present

## 2017-06-27 DIAGNOSIS — S82841D Displaced bimalleolar fracture of right lower leg, subsequent encounter for closed fracture with routine healing: Secondary | ICD-10-CM | POA: Diagnosis not present

## 2017-06-27 DIAGNOSIS — R262 Difficulty in walking, not elsewhere classified: Secondary | ICD-10-CM | POA: Diagnosis not present

## 2017-06-27 DIAGNOSIS — M6281 Muscle weakness (generalized): Secondary | ICD-10-CM | POA: Diagnosis not present

## 2017-06-27 DIAGNOSIS — E119 Type 2 diabetes mellitus without complications: Secondary | ICD-10-CM | POA: Diagnosis not present

## 2017-07-13 DIAGNOSIS — D519 Vitamin B12 deficiency anemia, unspecified: Secondary | ICD-10-CM | POA: Diagnosis not present

## 2017-07-28 DIAGNOSIS — S82841D Displaced bimalleolar fracture of right lower leg, subsequent encounter for closed fracture with routine healing: Secondary | ICD-10-CM | POA: Diagnosis not present

## 2017-07-28 DIAGNOSIS — E119 Type 2 diabetes mellitus without complications: Secondary | ICD-10-CM | POA: Diagnosis not present

## 2017-07-28 DIAGNOSIS — M6281 Muscle weakness (generalized): Secondary | ICD-10-CM | POA: Diagnosis not present

## 2017-07-28 DIAGNOSIS — R262 Difficulty in walking, not elsewhere classified: Secondary | ICD-10-CM | POA: Diagnosis not present

## 2017-08-05 DIAGNOSIS — E119 Type 2 diabetes mellitus without complications: Secondary | ICD-10-CM | POA: Diagnosis not present

## 2017-08-05 DIAGNOSIS — K219 Gastro-esophageal reflux disease without esophagitis: Secondary | ICD-10-CM | POA: Diagnosis not present

## 2017-08-05 DIAGNOSIS — Z6824 Body mass index (BMI) 24.0-24.9, adult: Secondary | ICD-10-CM | POA: Diagnosis not present

## 2017-08-05 DIAGNOSIS — I10 Essential (primary) hypertension: Secondary | ICD-10-CM | POA: Diagnosis not present

## 2017-08-05 DIAGNOSIS — Z79899 Other long term (current) drug therapy: Secondary | ICD-10-CM | POA: Diagnosis not present

## 2017-08-16 DIAGNOSIS — D519 Vitamin B12 deficiency anemia, unspecified: Secondary | ICD-10-CM | POA: Diagnosis not present

## 2017-09-16 DIAGNOSIS — D519 Vitamin B12 deficiency anemia, unspecified: Secondary | ICD-10-CM | POA: Diagnosis not present

## 2017-10-19 DIAGNOSIS — D519 Vitamin B12 deficiency anemia, unspecified: Secondary | ICD-10-CM | POA: Diagnosis not present

## 2017-11-09 DIAGNOSIS — D519 Vitamin B12 deficiency anemia, unspecified: Secondary | ICD-10-CM | POA: Diagnosis not present

## 2017-11-09 DIAGNOSIS — Z9181 History of falling: Secondary | ICD-10-CM | POA: Diagnosis not present

## 2017-11-09 DIAGNOSIS — Z23 Encounter for immunization: Secondary | ICD-10-CM | POA: Diagnosis not present

## 2017-11-09 DIAGNOSIS — E559 Vitamin D deficiency, unspecified: Secondary | ICD-10-CM | POA: Diagnosis not present

## 2017-11-09 DIAGNOSIS — Z79899 Other long term (current) drug therapy: Secondary | ICD-10-CM | POA: Diagnosis not present

## 2017-11-09 DIAGNOSIS — E119 Type 2 diabetes mellitus without complications: Secondary | ICD-10-CM | POA: Diagnosis not present

## 2017-11-09 DIAGNOSIS — Z139 Encounter for screening, unspecified: Secondary | ICD-10-CM | POA: Diagnosis not present

## 2017-11-09 DIAGNOSIS — I10 Essential (primary) hypertension: Secondary | ICD-10-CM | POA: Diagnosis not present

## 2017-11-09 DIAGNOSIS — E782 Mixed hyperlipidemia: Secondary | ICD-10-CM | POA: Diagnosis not present

## 2017-11-19 DIAGNOSIS — E119 Type 2 diabetes mellitus without complications: Secondary | ICD-10-CM | POA: Diagnosis not present

## 2017-11-19 DIAGNOSIS — D519 Vitamin B12 deficiency anemia, unspecified: Secondary | ICD-10-CM | POA: Diagnosis not present

## 2018-03-15 DIAGNOSIS — Z0000 Encounter for general adult medical examination without abnormal findings: Secondary | ICD-10-CM | POA: Diagnosis not present

## 2018-03-15 DIAGNOSIS — I10 Essential (primary) hypertension: Secondary | ICD-10-CM | POA: Diagnosis not present

## 2018-03-15 DIAGNOSIS — E785 Hyperlipidemia, unspecified: Secondary | ICD-10-CM | POA: Diagnosis not present

## 2018-03-15 DIAGNOSIS — Z1211 Encounter for screening for malignant neoplasm of colon: Secondary | ICD-10-CM | POA: Diagnosis not present

## 2018-03-15 DIAGNOSIS — E119 Type 2 diabetes mellitus without complications: Secondary | ICD-10-CM | POA: Diagnosis not present

## 2018-04-12 DIAGNOSIS — N3281 Overactive bladder: Secondary | ICD-10-CM | POA: Diagnosis not present

## 2018-04-12 DIAGNOSIS — E782 Mixed hyperlipidemia: Secondary | ICD-10-CM | POA: Diagnosis not present

## 2018-04-12 DIAGNOSIS — E119 Type 2 diabetes mellitus without complications: Secondary | ICD-10-CM | POA: Diagnosis not present

## 2018-04-12 DIAGNOSIS — J309 Allergic rhinitis, unspecified: Secondary | ICD-10-CM | POA: Diagnosis not present

## 2018-07-12 DIAGNOSIS — E782 Mixed hyperlipidemia: Secondary | ICD-10-CM | POA: Diagnosis not present

## 2018-07-12 DIAGNOSIS — Z6824 Body mass index (BMI) 24.0-24.9, adult: Secondary | ICD-10-CM | POA: Diagnosis not present

## 2018-07-12 DIAGNOSIS — I10 Essential (primary) hypertension: Secondary | ICD-10-CM | POA: Diagnosis not present

## 2018-07-12 DIAGNOSIS — E559 Vitamin D deficiency, unspecified: Secondary | ICD-10-CM | POA: Diagnosis not present

## 2018-07-12 DIAGNOSIS — E119 Type 2 diabetes mellitus without complications: Secondary | ICD-10-CM | POA: Diagnosis not present

## 2018-07-25 DIAGNOSIS — E782 Mixed hyperlipidemia: Secondary | ICD-10-CM | POA: Diagnosis not present

## 2018-07-25 DIAGNOSIS — D649 Anemia, unspecified: Secondary | ICD-10-CM | POA: Diagnosis not present

## 2018-07-25 DIAGNOSIS — E538 Deficiency of other specified B group vitamins: Secondary | ICD-10-CM | POA: Diagnosis not present

## 2018-07-25 DIAGNOSIS — E559 Vitamin D deficiency, unspecified: Secondary | ICD-10-CM | POA: Diagnosis not present

## 2018-07-25 DIAGNOSIS — E119 Type 2 diabetes mellitus without complications: Secondary | ICD-10-CM | POA: Diagnosis not present

## 2018-07-25 DIAGNOSIS — D519 Vitamin B12 deficiency anemia, unspecified: Secondary | ICD-10-CM | POA: Diagnosis not present

## 2018-11-07 DIAGNOSIS — K5900 Constipation, unspecified: Secondary | ICD-10-CM | POA: Diagnosis not present

## 2018-11-07 DIAGNOSIS — I10 Essential (primary) hypertension: Secondary | ICD-10-CM | POA: Diagnosis not present

## 2018-11-07 DIAGNOSIS — E782 Mixed hyperlipidemia: Secondary | ICD-10-CM | POA: Diagnosis not present

## 2018-11-07 DIAGNOSIS — E559 Vitamin D deficiency, unspecified: Secondary | ICD-10-CM | POA: Diagnosis not present

## 2018-11-07 DIAGNOSIS — E119 Type 2 diabetes mellitus without complications: Secondary | ICD-10-CM | POA: Diagnosis not present

## 2018-11-30 DIAGNOSIS — Z23 Encounter for immunization: Secondary | ICD-10-CM | POA: Diagnosis not present

## 2018-11-30 DIAGNOSIS — D582 Other hemoglobinopathies: Secondary | ICD-10-CM | POA: Diagnosis not present

## 2018-11-30 DIAGNOSIS — E538 Deficiency of other specified B group vitamins: Secondary | ICD-10-CM | POA: Diagnosis not present

## 2018-11-30 DIAGNOSIS — R5383 Other fatigue: Secondary | ICD-10-CM | POA: Diagnosis not present

## 2018-11-30 DIAGNOSIS — D649 Anemia, unspecified: Secondary | ICD-10-CM | POA: Diagnosis not present

## 2018-11-30 DIAGNOSIS — E559 Vitamin D deficiency, unspecified: Secondary | ICD-10-CM | POA: Diagnosis not present

## 2018-11-30 DIAGNOSIS — Z79899 Other long term (current) drug therapy: Secondary | ICD-10-CM | POA: Diagnosis not present

## 2018-11-30 DIAGNOSIS — Z1321 Encounter for screening for nutritional disorder: Secondary | ICD-10-CM | POA: Diagnosis not present

## 2018-11-30 DIAGNOSIS — D509 Iron deficiency anemia, unspecified: Secondary | ICD-10-CM | POA: Diagnosis not present

## 2018-11-30 DIAGNOSIS — E119 Type 2 diabetes mellitus without complications: Secondary | ICD-10-CM | POA: Diagnosis not present

## 2019-02-06 DIAGNOSIS — N3281 Overactive bladder: Secondary | ICD-10-CM | POA: Diagnosis not present

## 2019-02-06 DIAGNOSIS — I10 Essential (primary) hypertension: Secondary | ICD-10-CM | POA: Diagnosis not present

## 2019-02-06 DIAGNOSIS — E782 Mixed hyperlipidemia: Secondary | ICD-10-CM | POA: Diagnosis not present

## 2019-02-06 DIAGNOSIS — E119 Type 2 diabetes mellitus without complications: Secondary | ICD-10-CM | POA: Diagnosis not present

## 2019-02-06 DIAGNOSIS — E559 Vitamin D deficiency, unspecified: Secondary | ICD-10-CM | POA: Diagnosis not present

## 2019-02-07 DIAGNOSIS — N390 Urinary tract infection, site not specified: Secondary | ICD-10-CM | POA: Diagnosis not present

## 2019-04-04 DIAGNOSIS — J988 Other specified respiratory disorders: Secondary | ICD-10-CM | POA: Diagnosis not present

## 2019-04-04 DIAGNOSIS — E119 Type 2 diabetes mellitus without complications: Secondary | ICD-10-CM | POA: Diagnosis not present

## 2019-04-04 DIAGNOSIS — I10 Essential (primary) hypertension: Secondary | ICD-10-CM | POA: Diagnosis not present

## 2019-04-04 DIAGNOSIS — N390 Urinary tract infection, site not specified: Secondary | ICD-10-CM | POA: Diagnosis not present

## 2019-05-08 DIAGNOSIS — E119 Type 2 diabetes mellitus without complications: Secondary | ICD-10-CM | POA: Diagnosis not present

## 2019-05-08 DIAGNOSIS — I10 Essential (primary) hypertension: Secondary | ICD-10-CM | POA: Diagnosis not present

## 2019-05-08 DIAGNOSIS — E559 Vitamin D deficiency, unspecified: Secondary | ICD-10-CM | POA: Diagnosis not present

## 2019-05-08 DIAGNOSIS — E782 Mixed hyperlipidemia: Secondary | ICD-10-CM | POA: Diagnosis not present

## 2019-05-09 DIAGNOSIS — Z79899 Other long term (current) drug therapy: Secondary | ICD-10-CM | POA: Diagnosis not present

## 2019-05-09 DIAGNOSIS — E119 Type 2 diabetes mellitus without complications: Secondary | ICD-10-CM | POA: Diagnosis not present

## 2019-05-09 DIAGNOSIS — E782 Mixed hyperlipidemia: Secondary | ICD-10-CM | POA: Diagnosis not present

## 2019-05-09 DIAGNOSIS — E559 Vitamin D deficiency, unspecified: Secondary | ICD-10-CM | POA: Diagnosis not present

## 2019-05-10 DIAGNOSIS — E860 Dehydration: Secondary | ICD-10-CM | POA: Diagnosis not present

## 2019-05-10 DIAGNOSIS — R188 Other ascites: Secondary | ICD-10-CM | POA: Diagnosis not present

## 2019-05-10 DIAGNOSIS — I509 Heart failure, unspecified: Secondary | ICD-10-CM | POA: Diagnosis not present

## 2019-05-10 DIAGNOSIS — D649 Anemia, unspecified: Secondary | ICD-10-CM | POA: Diagnosis not present

## 2019-05-10 DIAGNOSIS — Z9181 History of falling: Secondary | ICD-10-CM | POA: Diagnosis not present

## 2019-05-10 DIAGNOSIS — R0602 Shortness of breath: Secondary | ICD-10-CM | POA: Diagnosis not present

## 2019-05-10 DIAGNOSIS — I110 Hypertensive heart disease with heart failure: Secondary | ICD-10-CM | POA: Diagnosis not present

## 2019-05-10 DIAGNOSIS — I700 Atherosclerosis of aorta: Secondary | ICD-10-CM | POA: Diagnosis not present

## 2019-05-10 DIAGNOSIS — K922 Gastrointestinal hemorrhage, unspecified: Secondary | ICD-10-CM | POA: Diagnosis not present

## 2019-05-10 DIAGNOSIS — N3281 Overactive bladder: Secondary | ICD-10-CM | POA: Diagnosis not present

## 2019-05-10 DIAGNOSIS — E559 Vitamin D deficiency, unspecified: Secondary | ICD-10-CM | POA: Diagnosis not present

## 2019-05-10 DIAGNOSIS — R918 Other nonspecific abnormal finding of lung field: Secondary | ICD-10-CM | POA: Diagnosis not present

## 2019-05-10 DIAGNOSIS — E871 Hypo-osmolality and hyponatremia: Secondary | ICD-10-CM | POA: Diagnosis not present

## 2019-05-10 DIAGNOSIS — Z87440 Personal history of urinary (tract) infections: Secondary | ICD-10-CM | POA: Diagnosis not present

## 2019-05-10 DIAGNOSIS — R739 Hyperglycemia, unspecified: Secondary | ICD-10-CM | POA: Diagnosis not present

## 2019-05-10 DIAGNOSIS — Z951 Presence of aortocoronary bypass graft: Secondary | ICD-10-CM | POA: Diagnosis not present

## 2019-05-10 DIAGNOSIS — E782 Mixed hyperlipidemia: Secondary | ICD-10-CM | POA: Diagnosis not present

## 2019-05-10 DIAGNOSIS — N179 Acute kidney failure, unspecified: Secondary | ICD-10-CM | POA: Diagnosis not present

## 2019-05-10 DIAGNOSIS — Z955 Presence of coronary angioplasty implant and graft: Secondary | ICD-10-CM | POA: Diagnosis not present

## 2019-05-10 DIAGNOSIS — D509 Iron deficiency anemia, unspecified: Secondary | ICD-10-CM | POA: Diagnosis not present

## 2019-05-10 DIAGNOSIS — E1165 Type 2 diabetes mellitus with hyperglycemia: Secondary | ICD-10-CM | POA: Diagnosis not present

## 2019-05-10 DIAGNOSIS — J309 Allergic rhinitis, unspecified: Secondary | ICD-10-CM | POA: Diagnosis not present

## 2019-05-10 DIAGNOSIS — Z794 Long term (current) use of insulin: Secondary | ICD-10-CM | POA: Diagnosis not present

## 2019-05-10 DIAGNOSIS — I2510 Atherosclerotic heart disease of native coronary artery without angina pectoris: Secondary | ICD-10-CM | POA: Diagnosis not present

## 2019-05-11 DIAGNOSIS — R739 Hyperglycemia, unspecified: Secondary | ICD-10-CM | POA: Diagnosis not present

## 2019-05-11 DIAGNOSIS — I361 Nonrheumatic tricuspid (valve) insufficiency: Secondary | ICD-10-CM | POA: Diagnosis not present

## 2019-05-11 DIAGNOSIS — N179 Acute kidney failure, unspecified: Secondary | ICD-10-CM | POA: Diagnosis not present

## 2019-05-11 DIAGNOSIS — I340 Nonrheumatic mitral (valve) insufficiency: Secondary | ICD-10-CM | POA: Diagnosis not present

## 2019-05-11 DIAGNOSIS — D649 Anemia, unspecified: Secondary | ICD-10-CM | POA: Diagnosis not present

## 2019-05-11 DIAGNOSIS — I509 Heart failure, unspecified: Secondary | ICD-10-CM | POA: Diagnosis not present

## 2019-05-11 DIAGNOSIS — J90 Pleural effusion, not elsewhere classified: Secondary | ICD-10-CM | POA: Diagnosis not present

## 2019-05-11 DIAGNOSIS — E860 Dehydration: Secondary | ICD-10-CM | POA: Diagnosis not present

## 2019-05-12 DIAGNOSIS — I2510 Atherosclerotic heart disease of native coronary artery without angina pectoris: Secondary | ICD-10-CM | POA: Diagnosis not present

## 2019-05-12 DIAGNOSIS — I509 Heart failure, unspecified: Secondary | ICD-10-CM | POA: Diagnosis not present

## 2019-05-12 DIAGNOSIS — Z7982 Long term (current) use of aspirin: Secondary | ICD-10-CM | POA: Diagnosis not present

## 2019-05-12 DIAGNOSIS — N179 Acute kidney failure, unspecified: Secondary | ICD-10-CM | POA: Diagnosis not present

## 2019-05-12 DIAGNOSIS — Z794 Long term (current) use of insulin: Secondary | ICD-10-CM | POA: Diagnosis not present

## 2019-05-12 DIAGNOSIS — I252 Old myocardial infarction: Secondary | ICD-10-CM | POA: Diagnosis not present

## 2019-05-12 DIAGNOSIS — K219 Gastro-esophageal reflux disease without esophagitis: Secondary | ICD-10-CM | POA: Diagnosis not present

## 2019-05-12 DIAGNOSIS — E1165 Type 2 diabetes mellitus with hyperglycemia: Secondary | ICD-10-CM | POA: Diagnosis not present

## 2019-05-12 DIAGNOSIS — E7800 Pure hypercholesterolemia, unspecified: Secondary | ICD-10-CM | POA: Diagnosis not present

## 2019-05-12 DIAGNOSIS — K644 Residual hemorrhoidal skin tags: Secondary | ICD-10-CM | POA: Diagnosis not present

## 2019-05-12 DIAGNOSIS — Z951 Presence of aortocoronary bypass graft: Secondary | ICD-10-CM | POA: Diagnosis not present

## 2019-05-12 DIAGNOSIS — K2980 Duodenitis without bleeding: Secondary | ICD-10-CM | POA: Diagnosis not present

## 2019-05-12 DIAGNOSIS — K5730 Diverticulosis of large intestine without perforation or abscess without bleeding: Secondary | ICD-10-CM | POA: Diagnosis not present

## 2019-05-12 DIAGNOSIS — E871 Hypo-osmolality and hyponatremia: Secondary | ICD-10-CM | POA: Diagnosis not present

## 2019-05-12 DIAGNOSIS — Z79891 Long term (current) use of opiate analgesic: Secondary | ICD-10-CM | POA: Diagnosis not present

## 2019-05-12 DIAGNOSIS — D649 Anemia, unspecified: Secondary | ICD-10-CM | POA: Diagnosis not present

## 2019-05-12 DIAGNOSIS — I110 Hypertensive heart disease with heart failure: Secondary | ICD-10-CM | POA: Diagnosis not present

## 2019-05-12 DIAGNOSIS — I248 Other forms of acute ischemic heart disease: Secondary | ICD-10-CM | POA: Diagnosis not present

## 2019-05-12 DIAGNOSIS — E860 Dehydration: Secondary | ICD-10-CM | POA: Diagnosis not present

## 2019-05-12 DIAGNOSIS — K922 Gastrointestinal hemorrhage, unspecified: Secondary | ICD-10-CM | POA: Diagnosis not present

## 2019-05-12 DIAGNOSIS — D500 Iron deficiency anemia secondary to blood loss (chronic): Secondary | ICD-10-CM | POA: Diagnosis not present

## 2019-05-12 DIAGNOSIS — I249 Acute ischemic heart disease, unspecified: Secondary | ICD-10-CM | POA: Diagnosis not present

## 2019-05-12 DIAGNOSIS — D509 Iron deficiency anemia, unspecified: Secondary | ICD-10-CM | POA: Diagnosis not present

## 2019-05-12 DIAGNOSIS — R739 Hyperglycemia, unspecified: Secondary | ICD-10-CM | POA: Diagnosis not present

## 2019-05-12 DIAGNOSIS — R296 Repeated falls: Secondary | ICD-10-CM | POA: Diagnosis not present

## 2019-05-12 DIAGNOSIS — R262 Difficulty in walking, not elsewhere classified: Secondary | ICD-10-CM | POA: Diagnosis not present

## 2019-05-12 DIAGNOSIS — Z955 Presence of coronary angioplasty implant and graft: Secondary | ICD-10-CM | POA: Diagnosis not present

## 2019-05-12 DIAGNOSIS — Z79899 Other long term (current) drug therapy: Secondary | ICD-10-CM | POA: Diagnosis not present

## 2019-05-12 DIAGNOSIS — Z880 Allergy status to penicillin: Secondary | ICD-10-CM | POA: Diagnosis not present

## 2019-05-13 DIAGNOSIS — N179 Acute kidney failure, unspecified: Secondary | ICD-10-CM | POA: Diagnosis not present

## 2019-05-13 DIAGNOSIS — E860 Dehydration: Secondary | ICD-10-CM | POA: Diagnosis not present

## 2019-05-13 DIAGNOSIS — R739 Hyperglycemia, unspecified: Secondary | ICD-10-CM | POA: Diagnosis not present

## 2019-05-13 DIAGNOSIS — D649 Anemia, unspecified: Secondary | ICD-10-CM | POA: Diagnosis not present

## 2019-05-14 DIAGNOSIS — D649 Anemia, unspecified: Secondary | ICD-10-CM | POA: Diagnosis not present

## 2019-05-14 DIAGNOSIS — N179 Acute kidney failure, unspecified: Secondary | ICD-10-CM | POA: Diagnosis not present

## 2019-05-14 DIAGNOSIS — E860 Dehydration: Secondary | ICD-10-CM | POA: Diagnosis not present

## 2019-05-14 DIAGNOSIS — R739 Hyperglycemia, unspecified: Secondary | ICD-10-CM | POA: Diagnosis not present

## 2019-05-16 ENCOUNTER — Telehealth: Payer: Self-pay | Admitting: Cardiology

## 2019-05-16 DIAGNOSIS — K922 Gastrointestinal hemorrhage, unspecified: Secondary | ICD-10-CM | POA: Diagnosis not present

## 2019-05-16 DIAGNOSIS — I509 Heart failure, unspecified: Secondary | ICD-10-CM | POA: Diagnosis not present

## 2019-05-16 DIAGNOSIS — Z955 Presence of coronary angioplasty implant and graft: Secondary | ICD-10-CM | POA: Diagnosis not present

## 2019-05-16 DIAGNOSIS — N179 Acute kidney failure, unspecified: Secondary | ICD-10-CM | POA: Diagnosis not present

## 2019-05-16 DIAGNOSIS — I2510 Atherosclerotic heart disease of native coronary artery without angina pectoris: Secondary | ICD-10-CM | POA: Diagnosis not present

## 2019-05-16 DIAGNOSIS — I110 Hypertensive heart disease with heart failure: Secondary | ICD-10-CM | POA: Diagnosis not present

## 2019-05-16 DIAGNOSIS — N3281 Overactive bladder: Secondary | ICD-10-CM | POA: Diagnosis not present

## 2019-05-16 DIAGNOSIS — Z951 Presence of aortocoronary bypass graft: Secondary | ICD-10-CM | POA: Diagnosis not present

## 2019-05-16 DIAGNOSIS — Z9181 History of falling: Secondary | ICD-10-CM | POA: Diagnosis not present

## 2019-05-16 DIAGNOSIS — E1165 Type 2 diabetes mellitus with hyperglycemia: Secondary | ICD-10-CM | POA: Diagnosis not present

## 2019-05-16 DIAGNOSIS — E559 Vitamin D deficiency, unspecified: Secondary | ICD-10-CM | POA: Diagnosis not present

## 2019-05-16 DIAGNOSIS — J309 Allergic rhinitis, unspecified: Secondary | ICD-10-CM | POA: Diagnosis not present

## 2019-05-16 DIAGNOSIS — E782 Mixed hyperlipidemia: Secondary | ICD-10-CM | POA: Diagnosis not present

## 2019-05-16 DIAGNOSIS — Z794 Long term (current) use of insulin: Secondary | ICD-10-CM | POA: Diagnosis not present

## 2019-05-16 DIAGNOSIS — R188 Other ascites: Secondary | ICD-10-CM | POA: Diagnosis not present

## 2019-05-16 DIAGNOSIS — Z87440 Personal history of urinary (tract) infections: Secondary | ICD-10-CM | POA: Diagnosis not present

## 2019-05-16 DIAGNOSIS — E871 Hypo-osmolality and hyponatremia: Secondary | ICD-10-CM | POA: Diagnosis not present

## 2019-05-16 DIAGNOSIS — D509 Iron deficiency anemia, unspecified: Secondary | ICD-10-CM | POA: Diagnosis not present

## 2019-05-16 DIAGNOSIS — I700 Atherosclerosis of aorta: Secondary | ICD-10-CM | POA: Diagnosis not present

## 2019-05-16 NOTE — Telephone Encounter (Signed)
Patient seen at [facility] - told to follow up with [facility]. Patient is scheduled for 05/29/19 with Dr. Harriet Masson but do not have records on file.

## 2019-05-19 DIAGNOSIS — D509 Iron deficiency anemia, unspecified: Secondary | ICD-10-CM | POA: Diagnosis not present

## 2019-05-19 DIAGNOSIS — Z9181 History of falling: Secondary | ICD-10-CM | POA: Diagnosis not present

## 2019-05-19 DIAGNOSIS — N3281 Overactive bladder: Secondary | ICD-10-CM | POA: Diagnosis not present

## 2019-05-19 DIAGNOSIS — Z87440 Personal history of urinary (tract) infections: Secondary | ICD-10-CM | POA: Diagnosis not present

## 2019-05-19 DIAGNOSIS — R188 Other ascites: Secondary | ICD-10-CM | POA: Diagnosis not present

## 2019-05-19 DIAGNOSIS — I2510 Atherosclerotic heart disease of native coronary artery without angina pectoris: Secondary | ICD-10-CM | POA: Diagnosis not present

## 2019-05-19 DIAGNOSIS — Z951 Presence of aortocoronary bypass graft: Secondary | ICD-10-CM | POA: Diagnosis not present

## 2019-05-19 DIAGNOSIS — I110 Hypertensive heart disease with heart failure: Secondary | ICD-10-CM | POA: Diagnosis not present

## 2019-05-19 DIAGNOSIS — E782 Mixed hyperlipidemia: Secondary | ICD-10-CM | POA: Diagnosis not present

## 2019-05-19 DIAGNOSIS — N179 Acute kidney failure, unspecified: Secondary | ICD-10-CM | POA: Diagnosis not present

## 2019-05-19 DIAGNOSIS — E1165 Type 2 diabetes mellitus with hyperglycemia: Secondary | ICD-10-CM | POA: Diagnosis not present

## 2019-05-19 DIAGNOSIS — I509 Heart failure, unspecified: Secondary | ICD-10-CM | POA: Diagnosis not present

## 2019-05-19 DIAGNOSIS — K922 Gastrointestinal hemorrhage, unspecified: Secondary | ICD-10-CM | POA: Diagnosis not present

## 2019-05-19 DIAGNOSIS — E559 Vitamin D deficiency, unspecified: Secondary | ICD-10-CM | POA: Diagnosis not present

## 2019-05-19 DIAGNOSIS — I700 Atherosclerosis of aorta: Secondary | ICD-10-CM | POA: Diagnosis not present

## 2019-05-19 DIAGNOSIS — J309 Allergic rhinitis, unspecified: Secondary | ICD-10-CM | POA: Diagnosis not present

## 2019-05-19 DIAGNOSIS — Z794 Long term (current) use of insulin: Secondary | ICD-10-CM | POA: Diagnosis not present

## 2019-05-19 DIAGNOSIS — Z955 Presence of coronary angioplasty implant and graft: Secondary | ICD-10-CM | POA: Diagnosis not present

## 2019-05-19 DIAGNOSIS — E871 Hypo-osmolality and hyponatremia: Secondary | ICD-10-CM | POA: Diagnosis not present

## 2019-05-22 DIAGNOSIS — I10 Essential (primary) hypertension: Secondary | ICD-10-CM | POA: Diagnosis not present

## 2019-05-22 DIAGNOSIS — E119 Type 2 diabetes mellitus without complications: Secondary | ICD-10-CM | POA: Diagnosis not present

## 2019-05-22 DIAGNOSIS — I509 Heart failure, unspecified: Secondary | ICD-10-CM | POA: Diagnosis not present

## 2019-05-22 DIAGNOSIS — Z09 Encounter for follow-up examination after completed treatment for conditions other than malignant neoplasm: Secondary | ICD-10-CM | POA: Diagnosis not present

## 2019-05-22 DIAGNOSIS — Z79899 Other long term (current) drug therapy: Secondary | ICD-10-CM | POA: Diagnosis not present

## 2019-05-23 DIAGNOSIS — N179 Acute kidney failure, unspecified: Secondary | ICD-10-CM | POA: Diagnosis not present

## 2019-05-23 DIAGNOSIS — Z87440 Personal history of urinary (tract) infections: Secondary | ICD-10-CM | POA: Diagnosis not present

## 2019-05-23 DIAGNOSIS — I110 Hypertensive heart disease with heart failure: Secondary | ICD-10-CM | POA: Diagnosis not present

## 2019-05-23 DIAGNOSIS — E782 Mixed hyperlipidemia: Secondary | ICD-10-CM | POA: Diagnosis not present

## 2019-05-23 DIAGNOSIS — E871 Hypo-osmolality and hyponatremia: Secondary | ICD-10-CM | POA: Diagnosis not present

## 2019-05-23 DIAGNOSIS — I2510 Atherosclerotic heart disease of native coronary artery without angina pectoris: Secondary | ICD-10-CM | POA: Diagnosis not present

## 2019-05-23 DIAGNOSIS — R188 Other ascites: Secondary | ICD-10-CM | POA: Diagnosis not present

## 2019-05-23 DIAGNOSIS — Z794 Long term (current) use of insulin: Secondary | ICD-10-CM | POA: Diagnosis not present

## 2019-05-23 DIAGNOSIS — I700 Atherosclerosis of aorta: Secondary | ICD-10-CM | POA: Diagnosis not present

## 2019-05-23 DIAGNOSIS — E1165 Type 2 diabetes mellitus with hyperglycemia: Secondary | ICD-10-CM | POA: Diagnosis not present

## 2019-05-23 DIAGNOSIS — D509 Iron deficiency anemia, unspecified: Secondary | ICD-10-CM | POA: Diagnosis not present

## 2019-05-23 DIAGNOSIS — Z955 Presence of coronary angioplasty implant and graft: Secondary | ICD-10-CM | POA: Diagnosis not present

## 2019-05-23 DIAGNOSIS — J309 Allergic rhinitis, unspecified: Secondary | ICD-10-CM | POA: Diagnosis not present

## 2019-05-23 DIAGNOSIS — Z951 Presence of aortocoronary bypass graft: Secondary | ICD-10-CM | POA: Diagnosis not present

## 2019-05-23 DIAGNOSIS — I509 Heart failure, unspecified: Secondary | ICD-10-CM | POA: Diagnosis not present

## 2019-05-23 DIAGNOSIS — K922 Gastrointestinal hemorrhage, unspecified: Secondary | ICD-10-CM | POA: Diagnosis not present

## 2019-05-23 DIAGNOSIS — E559 Vitamin D deficiency, unspecified: Secondary | ICD-10-CM | POA: Diagnosis not present

## 2019-05-23 DIAGNOSIS — Z9181 History of falling: Secondary | ICD-10-CM | POA: Diagnosis not present

## 2019-05-23 DIAGNOSIS — N3281 Overactive bladder: Secondary | ICD-10-CM | POA: Diagnosis not present

## 2019-05-25 ENCOUNTER — Encounter: Payer: Self-pay | Admitting: Cardiology

## 2019-05-25 ENCOUNTER — Other Ambulatory Visit: Payer: Self-pay

## 2019-05-25 ENCOUNTER — Ambulatory Visit (INDEPENDENT_AMBULATORY_CARE_PROVIDER_SITE_OTHER): Payer: Medicare Other | Admitting: Cardiology

## 2019-05-25 VITALS — BP 120/46 | HR 50

## 2019-05-25 DIAGNOSIS — I255 Ischemic cardiomyopathy: Secondary | ICD-10-CM | POA: Diagnosis not present

## 2019-05-25 DIAGNOSIS — E785 Hyperlipidemia, unspecified: Secondary | ICD-10-CM | POA: Diagnosis not present

## 2019-05-25 DIAGNOSIS — D500 Iron deficiency anemia secondary to blood loss (chronic): Secondary | ICD-10-CM

## 2019-05-25 DIAGNOSIS — I10 Essential (primary) hypertension: Secondary | ICD-10-CM

## 2019-05-25 DIAGNOSIS — I25810 Atherosclerosis of coronary artery bypass graft(s) without angina pectoris: Secondary | ICD-10-CM

## 2019-05-25 MED ORDER — CARVEDILOL 6.25 MG PO TABS: 6.2500 mg | ORAL_TABLET | Freq: Every day | ORAL | 12 refills | Status: DC

## 2019-05-25 NOTE — Patient Instructions (Signed)
Medication Instructions:   Your physician has recommended you make the following change in your medication:   Decrease your Coreg to 6.25 mg daily.  *If you need a refill on your cardiac medications before your next appointment, please call your pharmacy*   Lab Work: None ordered If you have labs (blood work) drawn today and your tests are completely normal, you will receive your results only by: Marland Kitchen MyChart Message (if you have MyChart) OR . A paper copy in the mail If you have any lab test that is abnormal or we need to change your treatment, we will call you to review the results.   Testing/Procedures: None ordered   Follow-Up: At [facility], you and your health needs are our priority.  As part of our continuing mission to provide you with exceptional heart care, we have created designated Provider Care Teams.  These Care Teams include your primary Cardiologist (physician) and Advanced Practice Providers (APPs -  Physician Assistants and Nurse Practitioners) who all work together to provide you with the care you need, when you need it.  We recommend signing up for the patient portal called "MyChart".  Sign up information is provided on this After Visit Summary.  MyChart is used to connect with patients for Virtual Visits (Telemedicine).  Patients are able to view lab/test results, encounter notes, upcoming appointments, etc.  Non-urgent messages can be sent to your provider as well.   To learn more about what you can do with MyChart, go to [email or web address].    Your next appointment:   1 month(s)  The format for your next appointment:   In Person  Provider:   Jyl Heinz, MD   Other Instructions NA

## 2019-05-25 NOTE — Progress Notes (Signed)
Cardiology Office Note:    Date:  05/25/2019   ID:  Alicia KEEVEN, DOB Jul 23, 1940, MRN [id number]  PCP:  Nicholos Johns, MD  Cardiologist:  Jenean Lindau, MD   Referring MD: Nicholos Johns, MD    ASSESSMENT:    1. Coronary artery disease involving coronary bypass graft of native heart without angina pectoris   2. Essential hypertension   3. Dyslipidemia   4. Ischemic cardiomyopathy   5. Blood loss anemia    PLAN:    In order of problems listed above:  1. Coronary artery disease and cardiomyopathy: Secondary prevention stressed to the patient.  Importance of compliance with diet and medication stressed and she vocalized understanding.  Her ejection fraction is 35 to 40% by echocardiogram.  I reassured her about my findings.  Currently I would not advise any active intervention.  She is a frail lady and medical management is what I would recommend.  Benefits and potential is explained and she vocalized understanding and she wants to pursue the medical route at this time. 2. Essential hypertension: Blood pressure stable.  Heart rate is borderline.  Blood pressure is also borderline and in view of feeling weak in general I would reduce her carvedilol to half dose of what she is taking now.  She will take 6.25 mg twice daily. 3. Mixed dyslipidemia and diabetes mellitus: Diet was emphasized.  Lab work followed by her primary care physician. 4. Blood loss anemia: Treated at the hospital and no active source was found on endoscopy.  She has hemorrhoids.  Again this will be monitored by her primary care physician.  The recommendation is to keep hemoglobin above 9 as to help her from a coronary standpoint. 5. Follow-up appointment in a month or earlier if she has any concerns.  Patient and family member had multiple questions which were answered to their satisfaction.  I reviewed [facility] records extensively.   Medication Adjustments/Labs and Tests Ordered: Current medicines are reviewed  at length with the patient today.  Concerns regarding medicines are outlined above.  No orders of the defined types were placed in this encounter.  No orders of the defined types were placed in this encounter.    History of Present Illness:    Alicia Collins is a 79 y.o. female who is being seen today for the evaluation of cardiomyopathy and troponin elevation at the hospital recently at the request of Nicholos Johns, MD.  Patient is a pleasant 79 year old female.  She has past medical history of coronary artery disease post CABG surgery in the past, essential hypertension dyslipidemia and diabetes mellitus.  She recently was admitted to the hospital with fatigue.  She was found to have significant anemia and received transfusion.  Subsequently she is feeling better.  No chest pain orthopnea or PND.  Her son accompanies her for this visit.  Echocardiogram revealed her EF to be 35 to 40% and her course was otherwise unremarkable from a cardiovascular standpoint.  Because of these reasons she was advised to follow-up with [place].  I have seen her in the past but this was in the remote past.  She is brought in here in a wheelchair.  Her family is very supportive.  At the time of my evaluation, the patient is alert awake oriented and in no distress.  Past Medical History:  Diagnosis Date  . Coronary artery disease involving coronary bypass graft of native heart without angina pectoris 08/14/2014  . Diabetes ([place])   . Dyslipidemia  08/14/2014  . Essential hypertension 08/14/2014  . GERD (gastroesophageal reflux disease)   . Hypertension     Past Surgical History:  Procedure Laterality Date  . ABDOMINAL HYSTERECTOMY    . ANGIOPLASTY    . CARDIAC CATHETERIZATION    . CORONARY ARTERY BYPASS GRAFT      Current Medications: Current Meds  Medication Sig  . acetaminophen (TYLENOL) 325 MG tablet Take 650 mg by mouth every 6 (six) hours as needed.  . carvedilol (COREG) 12.5 MG tablet Take by mouth.  .  ferrous sulfate 325 (65 FE) MG tablet Take 325 mg by mouth daily.  . furosemide (LASIX) 20 MG tablet Take by mouth.  . Glucosamine-Chondroitin (GLUCOSAMINE CHONDR COMPLEX PO) Take by mouth.  . INVOKANA 300 MG TABS tablet   . lisinopril (ZESTRIL) 40 MG tablet Take by mouth.  . magnesium oxide (MAG-OX) 400 (241.3 Mg) MG tablet Take 1 tablet by mouth 2 (two) times daily.  . metFORMIN (GLUCOPHAGE) 500 MG tablet Take by mouth.  . nitroGLYCERIN (NITROSTAT) 0.4 MG SL tablet Place under the tongue.  . Omega-3 Fatty Acids (OMEGA-3 FISH OIL PO) Take by mouth.  . simvastatin (ZOCOR) 80 MG tablet Take by mouth.  Nelva Nay SOLOSTAR 300 UNIT/ML Solostar Pen INJECT 20 UNITS SUBCUTANEOUSLY DAILY. INCREASE DOSE BY 1 UNIT DAILY UNTIL FASTING BLOOD SUGAR IS AROUND 120. (MAX DAILY DOSE OF 50 UNITS)     Allergies:   Rosuvastatin and Penicillins   Social History   Socioeconomic History  . Marital status: Married    Spouse name: Not on file  . Number of children: Not on file  . Years of education: Not on file  . Highest education level: Not on file  Occupational History  . Not on file  Tobacco Use  . Smoking status: Never Smoker  . Smokeless tobacco: Never Used  Substance and Sexual Activity  . Alcohol use: Not on file  . Drug use: Never  . Sexual activity: Not on file  Other Topics Concern  . Not on file  Social History Narrative  . Not on file   Social Determinants of Health   Financial Resource Strain:   . Difficulty of Paying Living Expenses:   Food Insecurity:   . Worried About Charity fundraiser in the Last Year:   . Arboriculturist in the Last Year:   Transportation Needs:   . Film/video editor (Medical):   Marland Kitchen Lack of Transportation (Non-Medical):   Physical Activity:   . Days of Exercise per Week:   . Minutes of Exercise per Session:   Stress:   . Feeling of Stress :   Social Connections:   . Frequency of Communication with Friends and Family:   . Frequency of Social  Gatherings with Friends and Family:   . Attends Religious Services:   . Active Member of Clubs or Organizations:   . Attends Archivist Meetings:   Marland Kitchen Marital Status:      Family History: The patient's family history includes Diabetes in her father; Heart disease in her father; Stroke in her father.  ROS:   Please see the history of present illness.    All other systems reviewed and are negative.  EKGs/Labs/Other Studies Reviewed:    The following studies were reviewed today: I reviewed [facility] records extensively.  Her echocardiogram has revealed ejection fraction of 35 to 40% with significant left atrial enlargement.  She was in sinus rhythm through the hospital course.  She denied any chest pain.   Recent Labs: No results found for requested labs within last 8760 hours.  Recent Lipid Panel No results found for: CHOL, TRIG, HDL, CHOLHDL, VLDL, LDLCALC, LDLDIRECT  Physical Exam:    VS:  BP (!) 120/46   Pulse (!) 50   SpO2 100%     Wt Readings from Last 3 Encounters:  No data found for Wt     GEN: Patient is in no acute distress HEENT: Normal NECK: No JVD; No carotid bruits LYMPHATICS: No lymphadenopathy CARDIAC: S1 S2 regular, 2/6 systolic murmur at the apex. RESPIRATORY:  Clear to auscultation without rales, wheezing or rhonchi  ABDOMEN: Soft, non-tender, non-distended MUSCULOSKELETAL:  No edema; No deformity  SKIN: Warm and dry NEUROLOGIC:  Alert and oriented x 3 PSYCHIATRIC:  Normal affect    Signed, Jenean Lindau, MD  05/25/2019 4:11 PM    [facility]

## 2019-05-29 ENCOUNTER — Ambulatory Visit: Payer: Medicare Other | Admitting: Cardiology

## 2019-05-30 DIAGNOSIS — D649 Anemia, unspecified: Secondary | ICD-10-CM | POA: Diagnosis not present

## 2019-06-01 DIAGNOSIS — I509 Heart failure, unspecified: Secondary | ICD-10-CM | POA: Diagnosis not present

## 2019-06-01 DIAGNOSIS — E1165 Type 2 diabetes mellitus with hyperglycemia: Secondary | ICD-10-CM | POA: Diagnosis not present

## 2019-06-01 DIAGNOSIS — E559 Vitamin D deficiency, unspecified: Secondary | ICD-10-CM | POA: Diagnosis not present

## 2019-06-01 DIAGNOSIS — D509 Iron deficiency anemia, unspecified: Secondary | ICD-10-CM | POA: Diagnosis not present

## 2019-06-01 DIAGNOSIS — N179 Acute kidney failure, unspecified: Secondary | ICD-10-CM | POA: Diagnosis not present

## 2019-06-01 DIAGNOSIS — Z951 Presence of aortocoronary bypass graft: Secondary | ICD-10-CM | POA: Diagnosis not present

## 2019-06-01 DIAGNOSIS — Z9181 History of falling: Secondary | ICD-10-CM | POA: Diagnosis not present

## 2019-06-01 DIAGNOSIS — R188 Other ascites: Secondary | ICD-10-CM | POA: Diagnosis not present

## 2019-06-01 DIAGNOSIS — E871 Hypo-osmolality and hyponatremia: Secondary | ICD-10-CM | POA: Diagnosis not present

## 2019-06-01 DIAGNOSIS — I2510 Atherosclerotic heart disease of native coronary artery without angina pectoris: Secondary | ICD-10-CM | POA: Diagnosis not present

## 2019-06-01 DIAGNOSIS — I110 Hypertensive heart disease with heart failure: Secondary | ICD-10-CM | POA: Diagnosis not present

## 2019-06-01 DIAGNOSIS — J309 Allergic rhinitis, unspecified: Secondary | ICD-10-CM | POA: Diagnosis not present

## 2019-06-01 DIAGNOSIS — Z794 Long term (current) use of insulin: Secondary | ICD-10-CM | POA: Diagnosis not present

## 2019-06-01 DIAGNOSIS — I700 Atherosclerosis of aorta: Secondary | ICD-10-CM | POA: Diagnosis not present

## 2019-06-01 DIAGNOSIS — Z955 Presence of coronary angioplasty implant and graft: Secondary | ICD-10-CM | POA: Diagnosis not present

## 2019-06-01 DIAGNOSIS — E782 Mixed hyperlipidemia: Secondary | ICD-10-CM | POA: Diagnosis not present

## 2019-06-01 DIAGNOSIS — K922 Gastrointestinal hemorrhage, unspecified: Secondary | ICD-10-CM | POA: Diagnosis not present

## 2019-06-01 DIAGNOSIS — N3281 Overactive bladder: Secondary | ICD-10-CM | POA: Diagnosis not present

## 2019-06-01 DIAGNOSIS — Z87440 Personal history of urinary (tract) infections: Secondary | ICD-10-CM | POA: Diagnosis not present

## 2019-06-05 DIAGNOSIS — K922 Gastrointestinal hemorrhage, unspecified: Secondary | ICD-10-CM | POA: Diagnosis not present

## 2019-06-05 DIAGNOSIS — Z794 Long term (current) use of insulin: Secondary | ICD-10-CM | POA: Diagnosis not present

## 2019-06-05 DIAGNOSIS — Z9181 History of falling: Secondary | ICD-10-CM | POA: Diagnosis not present

## 2019-06-05 DIAGNOSIS — Z87440 Personal history of urinary (tract) infections: Secondary | ICD-10-CM | POA: Diagnosis not present

## 2019-06-05 DIAGNOSIS — N179 Acute kidney failure, unspecified: Secondary | ICD-10-CM | POA: Diagnosis not present

## 2019-06-05 DIAGNOSIS — D509 Iron deficiency anemia, unspecified: Secondary | ICD-10-CM | POA: Diagnosis not present

## 2019-06-05 DIAGNOSIS — E782 Mixed hyperlipidemia: Secondary | ICD-10-CM | POA: Diagnosis not present

## 2019-06-05 DIAGNOSIS — N3281 Overactive bladder: Secondary | ICD-10-CM | POA: Diagnosis not present

## 2019-06-05 DIAGNOSIS — E559 Vitamin D deficiency, unspecified: Secondary | ICD-10-CM | POA: Diagnosis not present

## 2019-06-05 DIAGNOSIS — I509 Heart failure, unspecified: Secondary | ICD-10-CM | POA: Diagnosis not present

## 2019-06-05 DIAGNOSIS — R188 Other ascites: Secondary | ICD-10-CM | POA: Diagnosis not present

## 2019-06-05 DIAGNOSIS — I700 Atherosclerosis of aorta: Secondary | ICD-10-CM | POA: Diagnosis not present

## 2019-06-05 DIAGNOSIS — E871 Hypo-osmolality and hyponatremia: Secondary | ICD-10-CM | POA: Diagnosis not present

## 2019-06-05 DIAGNOSIS — J309 Allergic rhinitis, unspecified: Secondary | ICD-10-CM | POA: Diagnosis not present

## 2019-06-05 DIAGNOSIS — Z955 Presence of coronary angioplasty implant and graft: Secondary | ICD-10-CM | POA: Diagnosis not present

## 2019-06-05 DIAGNOSIS — E1165 Type 2 diabetes mellitus with hyperglycemia: Secondary | ICD-10-CM | POA: Diagnosis not present

## 2019-06-05 DIAGNOSIS — Z951 Presence of aortocoronary bypass graft: Secondary | ICD-10-CM | POA: Diagnosis not present

## 2019-06-05 DIAGNOSIS — I110 Hypertensive heart disease with heart failure: Secondary | ICD-10-CM | POA: Diagnosis not present

## 2019-06-05 DIAGNOSIS — I2510 Atherosclerotic heart disease of native coronary artery without angina pectoris: Secondary | ICD-10-CM | POA: Diagnosis not present

## 2019-06-07 DIAGNOSIS — I110 Hypertensive heart disease with heart failure: Secondary | ICD-10-CM | POA: Diagnosis not present

## 2019-06-07 DIAGNOSIS — N3281 Overactive bladder: Secondary | ICD-10-CM | POA: Diagnosis not present

## 2019-06-07 DIAGNOSIS — Z9181 History of falling: Secondary | ICD-10-CM | POA: Diagnosis not present

## 2019-06-07 DIAGNOSIS — Z794 Long term (current) use of insulin: Secondary | ICD-10-CM | POA: Diagnosis not present

## 2019-06-07 DIAGNOSIS — K922 Gastrointestinal hemorrhage, unspecified: Secondary | ICD-10-CM | POA: Diagnosis not present

## 2019-06-07 DIAGNOSIS — E1165 Type 2 diabetes mellitus with hyperglycemia: Secondary | ICD-10-CM | POA: Diagnosis not present

## 2019-06-07 DIAGNOSIS — E871 Hypo-osmolality and hyponatremia: Secondary | ICD-10-CM | POA: Diagnosis not present

## 2019-06-07 DIAGNOSIS — Z87440 Personal history of urinary (tract) infections: Secondary | ICD-10-CM | POA: Diagnosis not present

## 2019-06-07 DIAGNOSIS — J309 Allergic rhinitis, unspecified: Secondary | ICD-10-CM | POA: Diagnosis not present

## 2019-06-07 DIAGNOSIS — D509 Iron deficiency anemia, unspecified: Secondary | ICD-10-CM | POA: Diagnosis not present

## 2019-06-07 DIAGNOSIS — Z951 Presence of aortocoronary bypass graft: Secondary | ICD-10-CM | POA: Diagnosis not present

## 2019-06-07 DIAGNOSIS — R188 Other ascites: Secondary | ICD-10-CM | POA: Diagnosis not present

## 2019-06-07 DIAGNOSIS — E559 Vitamin D deficiency, unspecified: Secondary | ICD-10-CM | POA: Diagnosis not present

## 2019-06-07 DIAGNOSIS — N179 Acute kidney failure, unspecified: Secondary | ICD-10-CM | POA: Diagnosis not present

## 2019-06-07 DIAGNOSIS — I509 Heart failure, unspecified: Secondary | ICD-10-CM | POA: Diagnosis not present

## 2019-06-07 DIAGNOSIS — I700 Atherosclerosis of aorta: Secondary | ICD-10-CM | POA: Diagnosis not present

## 2019-06-07 DIAGNOSIS — I2510 Atherosclerotic heart disease of native coronary artery without angina pectoris: Secondary | ICD-10-CM | POA: Diagnosis not present

## 2019-06-07 DIAGNOSIS — Z955 Presence of coronary angioplasty implant and graft: Secondary | ICD-10-CM | POA: Diagnosis not present

## 2019-06-07 DIAGNOSIS — E782 Mixed hyperlipidemia: Secondary | ICD-10-CM | POA: Diagnosis not present

## 2019-06-08 DIAGNOSIS — E782 Mixed hyperlipidemia: Secondary | ICD-10-CM | POA: Diagnosis not present

## 2019-06-08 DIAGNOSIS — I509 Heart failure, unspecified: Secondary | ICD-10-CM | POA: Diagnosis not present

## 2019-06-08 DIAGNOSIS — N179 Acute kidney failure, unspecified: Secondary | ICD-10-CM | POA: Diagnosis not present

## 2019-06-08 DIAGNOSIS — I700 Atherosclerosis of aorta: Secondary | ICD-10-CM | POA: Diagnosis not present

## 2019-06-08 DIAGNOSIS — N3281 Overactive bladder: Secondary | ICD-10-CM | POA: Diagnosis not present

## 2019-06-08 DIAGNOSIS — Z794 Long term (current) use of insulin: Secondary | ICD-10-CM | POA: Diagnosis not present

## 2019-06-08 DIAGNOSIS — Z955 Presence of coronary angioplasty implant and graft: Secondary | ICD-10-CM | POA: Diagnosis not present

## 2019-06-08 DIAGNOSIS — E559 Vitamin D deficiency, unspecified: Secondary | ICD-10-CM | POA: Diagnosis not present

## 2019-06-08 DIAGNOSIS — I2510 Atherosclerotic heart disease of native coronary artery without angina pectoris: Secondary | ICD-10-CM | POA: Diagnosis not present

## 2019-06-08 DIAGNOSIS — E1165 Type 2 diabetes mellitus with hyperglycemia: Secondary | ICD-10-CM | POA: Diagnosis not present

## 2019-06-08 DIAGNOSIS — Z87440 Personal history of urinary (tract) infections: Secondary | ICD-10-CM | POA: Diagnosis not present

## 2019-06-08 DIAGNOSIS — I110 Hypertensive heart disease with heart failure: Secondary | ICD-10-CM | POA: Diagnosis not present

## 2019-06-08 DIAGNOSIS — D509 Iron deficiency anemia, unspecified: Secondary | ICD-10-CM | POA: Diagnosis not present

## 2019-06-08 DIAGNOSIS — K922 Gastrointestinal hemorrhage, unspecified: Secondary | ICD-10-CM | POA: Diagnosis not present

## 2019-06-08 DIAGNOSIS — J309 Allergic rhinitis, unspecified: Secondary | ICD-10-CM | POA: Diagnosis not present

## 2019-06-08 DIAGNOSIS — Z9181 History of falling: Secondary | ICD-10-CM | POA: Diagnosis not present

## 2019-06-08 DIAGNOSIS — Z951 Presence of aortocoronary bypass graft: Secondary | ICD-10-CM | POA: Diagnosis not present

## 2019-06-08 DIAGNOSIS — N189 Chronic kidney disease, unspecified: Secondary | ICD-10-CM | POA: Diagnosis not present

## 2019-06-08 DIAGNOSIS — D631 Anemia in chronic kidney disease: Secondary | ICD-10-CM | POA: Diagnosis not present

## 2019-06-08 DIAGNOSIS — E871 Hypo-osmolality and hyponatremia: Secondary | ICD-10-CM | POA: Diagnosis not present

## 2019-06-08 DIAGNOSIS — D649 Anemia, unspecified: Secondary | ICD-10-CM | POA: Diagnosis not present

## 2019-06-08 DIAGNOSIS — R188 Other ascites: Secondary | ICD-10-CM | POA: Diagnosis not present

## 2019-06-09 DIAGNOSIS — K922 Gastrointestinal hemorrhage, unspecified: Secondary | ICD-10-CM | POA: Diagnosis not present

## 2019-06-09 DIAGNOSIS — Z87440 Personal history of urinary (tract) infections: Secondary | ICD-10-CM | POA: Diagnosis not present

## 2019-06-09 DIAGNOSIS — I2510 Atherosclerotic heart disease of native coronary artery without angina pectoris: Secondary | ICD-10-CM | POA: Diagnosis not present

## 2019-06-09 DIAGNOSIS — E782 Mixed hyperlipidemia: Secondary | ICD-10-CM | POA: Diagnosis not present

## 2019-06-09 DIAGNOSIS — Z951 Presence of aortocoronary bypass graft: Secondary | ICD-10-CM | POA: Diagnosis not present

## 2019-06-09 DIAGNOSIS — I700 Atherosclerosis of aorta: Secondary | ICD-10-CM | POA: Diagnosis not present

## 2019-06-09 DIAGNOSIS — D509 Iron deficiency anemia, unspecified: Secondary | ICD-10-CM | POA: Diagnosis not present

## 2019-06-09 DIAGNOSIS — J309 Allergic rhinitis, unspecified: Secondary | ICD-10-CM | POA: Diagnosis not present

## 2019-06-09 DIAGNOSIS — Z955 Presence of coronary angioplasty implant and graft: Secondary | ICD-10-CM | POA: Diagnosis not present

## 2019-06-09 DIAGNOSIS — N179 Acute kidney failure, unspecified: Secondary | ICD-10-CM | POA: Diagnosis not present

## 2019-06-09 DIAGNOSIS — E559 Vitamin D deficiency, unspecified: Secondary | ICD-10-CM | POA: Diagnosis not present

## 2019-06-09 DIAGNOSIS — I509 Heart failure, unspecified: Secondary | ICD-10-CM | POA: Diagnosis not present

## 2019-06-09 DIAGNOSIS — Z9181 History of falling: Secondary | ICD-10-CM | POA: Diagnosis not present

## 2019-06-09 DIAGNOSIS — E1165 Type 2 diabetes mellitus with hyperglycemia: Secondary | ICD-10-CM | POA: Diagnosis not present

## 2019-06-09 DIAGNOSIS — E871 Hypo-osmolality and hyponatremia: Secondary | ICD-10-CM | POA: Diagnosis not present

## 2019-06-09 DIAGNOSIS — N3281 Overactive bladder: Secondary | ICD-10-CM | POA: Diagnosis not present

## 2019-06-09 DIAGNOSIS — I110 Hypertensive heart disease with heart failure: Secondary | ICD-10-CM | POA: Diagnosis not present

## 2019-06-09 DIAGNOSIS — R188 Other ascites: Secondary | ICD-10-CM | POA: Diagnosis not present

## 2019-06-09 DIAGNOSIS — Z794 Long term (current) use of insulin: Secondary | ICD-10-CM | POA: Diagnosis not present

## 2019-06-12 DIAGNOSIS — Z87440 Personal history of urinary (tract) infections: Secondary | ICD-10-CM | POA: Diagnosis not present

## 2019-06-12 DIAGNOSIS — N3281 Overactive bladder: Secondary | ICD-10-CM | POA: Diagnosis not present

## 2019-06-12 DIAGNOSIS — E559 Vitamin D deficiency, unspecified: Secondary | ICD-10-CM | POA: Diagnosis not present

## 2019-06-12 DIAGNOSIS — Z955 Presence of coronary angioplasty implant and graft: Secondary | ICD-10-CM | POA: Diagnosis not present

## 2019-06-12 DIAGNOSIS — Z9181 History of falling: Secondary | ICD-10-CM | POA: Diagnosis not present

## 2019-06-12 DIAGNOSIS — D509 Iron deficiency anemia, unspecified: Secondary | ICD-10-CM | POA: Diagnosis not present

## 2019-06-12 DIAGNOSIS — E871 Hypo-osmolality and hyponatremia: Secondary | ICD-10-CM | POA: Diagnosis not present

## 2019-06-12 DIAGNOSIS — I700 Atherosclerosis of aorta: Secondary | ICD-10-CM | POA: Diagnosis not present

## 2019-06-12 DIAGNOSIS — R269 Unspecified abnormalities of gait and mobility: Secondary | ICD-10-CM | POA: Diagnosis not present

## 2019-06-12 DIAGNOSIS — N179 Acute kidney failure, unspecified: Secondary | ICD-10-CM | POA: Diagnosis not present

## 2019-06-12 DIAGNOSIS — I110 Hypertensive heart disease with heart failure: Secondary | ICD-10-CM | POA: Diagnosis not present

## 2019-06-12 DIAGNOSIS — E782 Mixed hyperlipidemia: Secondary | ICD-10-CM | POA: Diagnosis not present

## 2019-06-12 DIAGNOSIS — E1165 Type 2 diabetes mellitus with hyperglycemia: Secondary | ICD-10-CM | POA: Diagnosis not present

## 2019-06-12 DIAGNOSIS — Z951 Presence of aortocoronary bypass graft: Secondary | ICD-10-CM | POA: Diagnosis not present

## 2019-06-12 DIAGNOSIS — R188 Other ascites: Secondary | ICD-10-CM | POA: Diagnosis not present

## 2019-06-12 DIAGNOSIS — I509 Heart failure, unspecified: Secondary | ICD-10-CM | POA: Diagnosis not present

## 2019-06-12 DIAGNOSIS — J309 Allergic rhinitis, unspecified: Secondary | ICD-10-CM | POA: Diagnosis not present

## 2019-06-12 DIAGNOSIS — I2510 Atherosclerotic heart disease of native coronary artery without angina pectoris: Secondary | ICD-10-CM | POA: Diagnosis not present

## 2019-06-12 DIAGNOSIS — K922 Gastrointestinal hemorrhage, unspecified: Secondary | ICD-10-CM | POA: Diagnosis not present

## 2019-06-12 DIAGNOSIS — Z794 Long term (current) use of insulin: Secondary | ICD-10-CM | POA: Diagnosis not present

## 2019-06-13 DIAGNOSIS — I110 Hypertensive heart disease with heart failure: Secondary | ICD-10-CM | POA: Diagnosis not present

## 2019-06-13 DIAGNOSIS — Z87440 Personal history of urinary (tract) infections: Secondary | ICD-10-CM | POA: Diagnosis not present

## 2019-06-13 DIAGNOSIS — J309 Allergic rhinitis, unspecified: Secondary | ICD-10-CM | POA: Diagnosis not present

## 2019-06-13 DIAGNOSIS — Z794 Long term (current) use of insulin: Secondary | ICD-10-CM | POA: Diagnosis not present

## 2019-06-13 DIAGNOSIS — N3281 Overactive bladder: Secondary | ICD-10-CM | POA: Diagnosis not present

## 2019-06-13 DIAGNOSIS — Z951 Presence of aortocoronary bypass graft: Secondary | ICD-10-CM | POA: Diagnosis not present

## 2019-06-13 DIAGNOSIS — Z9181 History of falling: Secondary | ICD-10-CM | POA: Diagnosis not present

## 2019-06-13 DIAGNOSIS — D509 Iron deficiency anemia, unspecified: Secondary | ICD-10-CM | POA: Diagnosis not present

## 2019-06-13 DIAGNOSIS — K922 Gastrointestinal hemorrhage, unspecified: Secondary | ICD-10-CM | POA: Diagnosis not present

## 2019-06-13 DIAGNOSIS — E559 Vitamin D deficiency, unspecified: Secondary | ICD-10-CM | POA: Diagnosis not present

## 2019-06-13 DIAGNOSIS — I700 Atherosclerosis of aorta: Secondary | ICD-10-CM | POA: Diagnosis not present

## 2019-06-13 DIAGNOSIS — E1165 Type 2 diabetes mellitus with hyperglycemia: Secondary | ICD-10-CM | POA: Diagnosis not present

## 2019-06-13 DIAGNOSIS — E782 Mixed hyperlipidemia: Secondary | ICD-10-CM | POA: Diagnosis not present

## 2019-06-13 DIAGNOSIS — R188 Other ascites: Secondary | ICD-10-CM | POA: Diagnosis not present

## 2019-06-13 DIAGNOSIS — Z955 Presence of coronary angioplasty implant and graft: Secondary | ICD-10-CM | POA: Diagnosis not present

## 2019-06-13 DIAGNOSIS — E871 Hypo-osmolality and hyponatremia: Secondary | ICD-10-CM | POA: Diagnosis not present

## 2019-06-13 DIAGNOSIS — I509 Heart failure, unspecified: Secondary | ICD-10-CM | POA: Diagnosis not present

## 2019-06-13 DIAGNOSIS — I2510 Atherosclerotic heart disease of native coronary artery without angina pectoris: Secondary | ICD-10-CM | POA: Diagnosis not present

## 2019-06-13 DIAGNOSIS — N179 Acute kidney failure, unspecified: Secondary | ICD-10-CM | POA: Diagnosis not present

## 2019-06-14 DIAGNOSIS — Z794 Long term (current) use of insulin: Secondary | ICD-10-CM | POA: Diagnosis not present

## 2019-06-14 DIAGNOSIS — I110 Hypertensive heart disease with heart failure: Secondary | ICD-10-CM | POA: Diagnosis not present

## 2019-06-14 DIAGNOSIS — E782 Mixed hyperlipidemia: Secondary | ICD-10-CM | POA: Diagnosis not present

## 2019-06-14 DIAGNOSIS — N179 Acute kidney failure, unspecified: Secondary | ICD-10-CM | POA: Diagnosis not present

## 2019-06-14 DIAGNOSIS — I2510 Atherosclerotic heart disease of native coronary artery without angina pectoris: Secondary | ICD-10-CM | POA: Diagnosis not present

## 2019-06-14 DIAGNOSIS — E871 Hypo-osmolality and hyponatremia: Secondary | ICD-10-CM | POA: Diagnosis not present

## 2019-06-14 DIAGNOSIS — I700 Atherosclerosis of aorta: Secondary | ICD-10-CM | POA: Diagnosis not present

## 2019-06-14 DIAGNOSIS — Z87440 Personal history of urinary (tract) infections: Secondary | ICD-10-CM | POA: Diagnosis not present

## 2019-06-14 DIAGNOSIS — Z9181 History of falling: Secondary | ICD-10-CM | POA: Diagnosis not present

## 2019-06-14 DIAGNOSIS — R188 Other ascites: Secondary | ICD-10-CM | POA: Diagnosis not present

## 2019-06-14 DIAGNOSIS — J309 Allergic rhinitis, unspecified: Secondary | ICD-10-CM | POA: Diagnosis not present

## 2019-06-14 DIAGNOSIS — E559 Vitamin D deficiency, unspecified: Secondary | ICD-10-CM | POA: Diagnosis not present

## 2019-06-14 DIAGNOSIS — E1165 Type 2 diabetes mellitus with hyperglycemia: Secondary | ICD-10-CM | POA: Diagnosis not present

## 2019-06-14 DIAGNOSIS — Z951 Presence of aortocoronary bypass graft: Secondary | ICD-10-CM | POA: Diagnosis not present

## 2019-06-14 DIAGNOSIS — I509 Heart failure, unspecified: Secondary | ICD-10-CM | POA: Diagnosis not present

## 2019-06-14 DIAGNOSIS — D509 Iron deficiency anemia, unspecified: Secondary | ICD-10-CM | POA: Diagnosis not present

## 2019-06-14 DIAGNOSIS — Z955 Presence of coronary angioplasty implant and graft: Secondary | ICD-10-CM | POA: Diagnosis not present

## 2019-06-14 DIAGNOSIS — K922 Gastrointestinal hemorrhage, unspecified: Secondary | ICD-10-CM | POA: Diagnosis not present

## 2019-06-14 DIAGNOSIS — N3281 Overactive bladder: Secondary | ICD-10-CM | POA: Diagnosis not present

## 2019-06-15 DIAGNOSIS — I509 Heart failure, unspecified: Secondary | ICD-10-CM | POA: Diagnosis not present

## 2019-06-15 DIAGNOSIS — N3281 Overactive bladder: Secondary | ICD-10-CM | POA: Diagnosis not present

## 2019-06-15 DIAGNOSIS — K922 Gastrointestinal hemorrhage, unspecified: Secondary | ICD-10-CM | POA: Diagnosis not present

## 2019-06-15 DIAGNOSIS — Z951 Presence of aortocoronary bypass graft: Secondary | ICD-10-CM | POA: Diagnosis not present

## 2019-06-15 DIAGNOSIS — D509 Iron deficiency anemia, unspecified: Secondary | ICD-10-CM | POA: Diagnosis not present

## 2019-06-15 DIAGNOSIS — E559 Vitamin D deficiency, unspecified: Secondary | ICD-10-CM | POA: Diagnosis not present

## 2019-06-15 DIAGNOSIS — J309 Allergic rhinitis, unspecified: Secondary | ICD-10-CM | POA: Diagnosis not present

## 2019-06-15 DIAGNOSIS — I2510 Atherosclerotic heart disease of native coronary artery without angina pectoris: Secondary | ICD-10-CM | POA: Diagnosis not present

## 2019-06-15 DIAGNOSIS — I700 Atherosclerosis of aorta: Secondary | ICD-10-CM | POA: Diagnosis not present

## 2019-06-15 DIAGNOSIS — E871 Hypo-osmolality and hyponatremia: Secondary | ICD-10-CM | POA: Diagnosis not present

## 2019-06-15 DIAGNOSIS — R188 Other ascites: Secondary | ICD-10-CM | POA: Diagnosis not present

## 2019-06-15 DIAGNOSIS — Z87440 Personal history of urinary (tract) infections: Secondary | ICD-10-CM | POA: Diagnosis not present

## 2019-06-15 DIAGNOSIS — Z955 Presence of coronary angioplasty implant and graft: Secondary | ICD-10-CM | POA: Diagnosis not present

## 2019-06-15 DIAGNOSIS — Z9181 History of falling: Secondary | ICD-10-CM | POA: Diagnosis not present

## 2019-06-15 DIAGNOSIS — E782 Mixed hyperlipidemia: Secondary | ICD-10-CM | POA: Diagnosis not present

## 2019-06-15 DIAGNOSIS — I110 Hypertensive heart disease with heart failure: Secondary | ICD-10-CM | POA: Diagnosis not present

## 2019-06-15 DIAGNOSIS — Z794 Long term (current) use of insulin: Secondary | ICD-10-CM | POA: Diagnosis not present

## 2019-06-15 DIAGNOSIS — N179 Acute kidney failure, unspecified: Secondary | ICD-10-CM | POA: Diagnosis not present

## 2019-06-15 DIAGNOSIS — E1165 Type 2 diabetes mellitus with hyperglycemia: Secondary | ICD-10-CM | POA: Diagnosis not present

## 2019-06-16 DIAGNOSIS — R188 Other ascites: Secondary | ICD-10-CM | POA: Diagnosis not present

## 2019-06-16 DIAGNOSIS — J309 Allergic rhinitis, unspecified: Secondary | ICD-10-CM | POA: Diagnosis not present

## 2019-06-16 DIAGNOSIS — Z955 Presence of coronary angioplasty implant and graft: Secondary | ICD-10-CM | POA: Diagnosis not present

## 2019-06-16 DIAGNOSIS — Z87440 Personal history of urinary (tract) infections: Secondary | ICD-10-CM | POA: Diagnosis not present

## 2019-06-16 DIAGNOSIS — K922 Gastrointestinal hemorrhage, unspecified: Secondary | ICD-10-CM | POA: Diagnosis not present

## 2019-06-16 DIAGNOSIS — E782 Mixed hyperlipidemia: Secondary | ICD-10-CM | POA: Diagnosis not present

## 2019-06-16 DIAGNOSIS — E1165 Type 2 diabetes mellitus with hyperglycemia: Secondary | ICD-10-CM | POA: Diagnosis not present

## 2019-06-16 DIAGNOSIS — E559 Vitamin D deficiency, unspecified: Secondary | ICD-10-CM | POA: Diagnosis not present

## 2019-06-16 DIAGNOSIS — Z951 Presence of aortocoronary bypass graft: Secondary | ICD-10-CM | POA: Diagnosis not present

## 2019-06-16 DIAGNOSIS — I700 Atherosclerosis of aorta: Secondary | ICD-10-CM | POA: Diagnosis not present

## 2019-06-16 DIAGNOSIS — D509 Iron deficiency anemia, unspecified: Secondary | ICD-10-CM | POA: Diagnosis not present

## 2019-06-16 DIAGNOSIS — N3281 Overactive bladder: Secondary | ICD-10-CM | POA: Diagnosis not present

## 2019-06-16 DIAGNOSIS — E871 Hypo-osmolality and hyponatremia: Secondary | ICD-10-CM | POA: Diagnosis not present

## 2019-06-16 DIAGNOSIS — N179 Acute kidney failure, unspecified: Secondary | ICD-10-CM | POA: Diagnosis not present

## 2019-06-16 DIAGNOSIS — Z794 Long term (current) use of insulin: Secondary | ICD-10-CM | POA: Diagnosis not present

## 2019-06-16 DIAGNOSIS — I110 Hypertensive heart disease with heart failure: Secondary | ICD-10-CM | POA: Diagnosis not present

## 2019-06-16 DIAGNOSIS — I509 Heart failure, unspecified: Secondary | ICD-10-CM | POA: Diagnosis not present

## 2019-06-16 DIAGNOSIS — Z9181 History of falling: Secondary | ICD-10-CM | POA: Diagnosis not present

## 2019-06-16 DIAGNOSIS — I2510 Atherosclerotic heart disease of native coronary artery without angina pectoris: Secondary | ICD-10-CM | POA: Diagnosis not present

## 2019-06-19 DIAGNOSIS — K922 Gastrointestinal hemorrhage, unspecified: Secondary | ICD-10-CM | POA: Diagnosis not present

## 2019-06-19 DIAGNOSIS — Z87440 Personal history of urinary (tract) infections: Secondary | ICD-10-CM | POA: Diagnosis not present

## 2019-06-19 DIAGNOSIS — N179 Acute kidney failure, unspecified: Secondary | ICD-10-CM | POA: Diagnosis not present

## 2019-06-19 DIAGNOSIS — E1165 Type 2 diabetes mellitus with hyperglycemia: Secondary | ICD-10-CM | POA: Diagnosis not present

## 2019-06-19 DIAGNOSIS — I110 Hypertensive heart disease with heart failure: Secondary | ICD-10-CM | POA: Diagnosis not present

## 2019-06-19 DIAGNOSIS — E782 Mixed hyperlipidemia: Secondary | ICD-10-CM | POA: Diagnosis not present

## 2019-06-19 DIAGNOSIS — D509 Iron deficiency anemia, unspecified: Secondary | ICD-10-CM | POA: Diagnosis not present

## 2019-06-19 DIAGNOSIS — E559 Vitamin D deficiency, unspecified: Secondary | ICD-10-CM | POA: Diagnosis not present

## 2019-06-19 DIAGNOSIS — I700 Atherosclerosis of aorta: Secondary | ICD-10-CM | POA: Diagnosis not present

## 2019-06-19 DIAGNOSIS — Z794 Long term (current) use of insulin: Secondary | ICD-10-CM | POA: Diagnosis not present

## 2019-06-19 DIAGNOSIS — Z951 Presence of aortocoronary bypass graft: Secondary | ICD-10-CM | POA: Diagnosis not present

## 2019-06-19 DIAGNOSIS — Z955 Presence of coronary angioplasty implant and graft: Secondary | ICD-10-CM | POA: Diagnosis not present

## 2019-06-19 DIAGNOSIS — E871 Hypo-osmolality and hyponatremia: Secondary | ICD-10-CM | POA: Diagnosis not present

## 2019-06-19 DIAGNOSIS — Z9181 History of falling: Secondary | ICD-10-CM | POA: Diagnosis not present

## 2019-06-19 DIAGNOSIS — I509 Heart failure, unspecified: Secondary | ICD-10-CM | POA: Diagnosis not present

## 2019-06-19 DIAGNOSIS — I2510 Atherosclerotic heart disease of native coronary artery without angina pectoris: Secondary | ICD-10-CM | POA: Diagnosis not present

## 2019-06-19 DIAGNOSIS — R188 Other ascites: Secondary | ICD-10-CM | POA: Diagnosis not present

## 2019-06-19 DIAGNOSIS — N3281 Overactive bladder: Secondary | ICD-10-CM | POA: Diagnosis not present

## 2019-06-19 DIAGNOSIS — J309 Allergic rhinitis, unspecified: Secondary | ICD-10-CM | POA: Diagnosis not present

## 2019-06-20 DIAGNOSIS — E782 Mixed hyperlipidemia: Secondary | ICD-10-CM | POA: Diagnosis not present

## 2019-06-20 DIAGNOSIS — Z951 Presence of aortocoronary bypass graft: Secondary | ICD-10-CM | POA: Diagnosis not present

## 2019-06-20 DIAGNOSIS — N179 Acute kidney failure, unspecified: Secondary | ICD-10-CM | POA: Diagnosis not present

## 2019-06-20 DIAGNOSIS — N3281 Overactive bladder: Secondary | ICD-10-CM | POA: Diagnosis not present

## 2019-06-20 DIAGNOSIS — I700 Atherosclerosis of aorta: Secondary | ICD-10-CM | POA: Diagnosis not present

## 2019-06-20 DIAGNOSIS — Z9181 History of falling: Secondary | ICD-10-CM | POA: Diagnosis not present

## 2019-06-20 DIAGNOSIS — I2510 Atherosclerotic heart disease of native coronary artery without angina pectoris: Secondary | ICD-10-CM | POA: Diagnosis not present

## 2019-06-20 DIAGNOSIS — K922 Gastrointestinal hemorrhage, unspecified: Secondary | ICD-10-CM | POA: Diagnosis not present

## 2019-06-20 DIAGNOSIS — I509 Heart failure, unspecified: Secondary | ICD-10-CM | POA: Diagnosis not present

## 2019-06-20 DIAGNOSIS — Z955 Presence of coronary angioplasty implant and graft: Secondary | ICD-10-CM | POA: Diagnosis not present

## 2019-06-20 DIAGNOSIS — I110 Hypertensive heart disease with heart failure: Secondary | ICD-10-CM | POA: Diagnosis not present

## 2019-06-20 DIAGNOSIS — E871 Hypo-osmolality and hyponatremia: Secondary | ICD-10-CM | POA: Diagnosis not present

## 2019-06-20 DIAGNOSIS — D509 Iron deficiency anemia, unspecified: Secondary | ICD-10-CM | POA: Diagnosis not present

## 2019-06-20 DIAGNOSIS — Z87440 Personal history of urinary (tract) infections: Secondary | ICD-10-CM | POA: Diagnosis not present

## 2019-06-20 DIAGNOSIS — E559 Vitamin D deficiency, unspecified: Secondary | ICD-10-CM | POA: Diagnosis not present

## 2019-06-20 DIAGNOSIS — Z794 Long term (current) use of insulin: Secondary | ICD-10-CM | POA: Diagnosis not present

## 2019-06-20 DIAGNOSIS — J309 Allergic rhinitis, unspecified: Secondary | ICD-10-CM | POA: Diagnosis not present

## 2019-06-20 DIAGNOSIS — E1165 Type 2 diabetes mellitus with hyperglycemia: Secondary | ICD-10-CM | POA: Diagnosis not present

## 2019-06-20 DIAGNOSIS — R188 Other ascites: Secondary | ICD-10-CM | POA: Diagnosis not present

## 2019-06-21 DIAGNOSIS — E559 Vitamin D deficiency, unspecified: Secondary | ICD-10-CM | POA: Diagnosis not present

## 2019-06-21 DIAGNOSIS — E871 Hypo-osmolality and hyponatremia: Secondary | ICD-10-CM | POA: Diagnosis not present

## 2019-06-21 DIAGNOSIS — Z794 Long term (current) use of insulin: Secondary | ICD-10-CM | POA: Diagnosis not present

## 2019-06-21 DIAGNOSIS — Z9181 History of falling: Secondary | ICD-10-CM | POA: Diagnosis not present

## 2019-06-21 DIAGNOSIS — I509 Heart failure, unspecified: Secondary | ICD-10-CM | POA: Diagnosis not present

## 2019-06-21 DIAGNOSIS — R188 Other ascites: Secondary | ICD-10-CM | POA: Diagnosis not present

## 2019-06-21 DIAGNOSIS — Z951 Presence of aortocoronary bypass graft: Secondary | ICD-10-CM | POA: Diagnosis not present

## 2019-06-21 DIAGNOSIS — I2510 Atherosclerotic heart disease of native coronary artery without angina pectoris: Secondary | ICD-10-CM | POA: Diagnosis not present

## 2019-06-21 DIAGNOSIS — I110 Hypertensive heart disease with heart failure: Secondary | ICD-10-CM | POA: Diagnosis not present

## 2019-06-21 DIAGNOSIS — E782 Mixed hyperlipidemia: Secondary | ICD-10-CM | POA: Diagnosis not present

## 2019-06-21 DIAGNOSIS — D509 Iron deficiency anemia, unspecified: Secondary | ICD-10-CM | POA: Diagnosis not present

## 2019-06-21 DIAGNOSIS — K922 Gastrointestinal hemorrhage, unspecified: Secondary | ICD-10-CM | POA: Diagnosis not present

## 2019-06-21 DIAGNOSIS — E1165 Type 2 diabetes mellitus with hyperglycemia: Secondary | ICD-10-CM | POA: Diagnosis not present

## 2019-06-21 DIAGNOSIS — I700 Atherosclerosis of aorta: Secondary | ICD-10-CM | POA: Diagnosis not present

## 2019-06-21 DIAGNOSIS — N3281 Overactive bladder: Secondary | ICD-10-CM | POA: Diagnosis not present

## 2019-06-21 DIAGNOSIS — Z955 Presence of coronary angioplasty implant and graft: Secondary | ICD-10-CM | POA: Diagnosis not present

## 2019-06-21 DIAGNOSIS — Z87440 Personal history of urinary (tract) infections: Secondary | ICD-10-CM | POA: Diagnosis not present

## 2019-06-21 DIAGNOSIS — J309 Allergic rhinitis, unspecified: Secondary | ICD-10-CM | POA: Diagnosis not present

## 2019-06-21 DIAGNOSIS — N179 Acute kidney failure, unspecified: Secondary | ICD-10-CM | POA: Diagnosis not present

## 2019-06-22 DIAGNOSIS — I509 Heart failure, unspecified: Secondary | ICD-10-CM | POA: Diagnosis not present

## 2019-06-22 DIAGNOSIS — I700 Atherosclerosis of aorta: Secondary | ICD-10-CM | POA: Diagnosis not present

## 2019-06-22 DIAGNOSIS — I2510 Atherosclerotic heart disease of native coronary artery without angina pectoris: Secondary | ICD-10-CM | POA: Diagnosis not present

## 2019-06-22 DIAGNOSIS — E782 Mixed hyperlipidemia: Secondary | ICD-10-CM | POA: Diagnosis not present

## 2019-06-22 DIAGNOSIS — N179 Acute kidney failure, unspecified: Secondary | ICD-10-CM | POA: Diagnosis not present

## 2019-06-22 DIAGNOSIS — Z87440 Personal history of urinary (tract) infections: Secondary | ICD-10-CM | POA: Diagnosis not present

## 2019-06-22 DIAGNOSIS — Z794 Long term (current) use of insulin: Secondary | ICD-10-CM | POA: Diagnosis not present

## 2019-06-22 DIAGNOSIS — N3281 Overactive bladder: Secondary | ICD-10-CM | POA: Diagnosis not present

## 2019-06-22 DIAGNOSIS — R188 Other ascites: Secondary | ICD-10-CM | POA: Diagnosis not present

## 2019-06-22 DIAGNOSIS — Z955 Presence of coronary angioplasty implant and graft: Secondary | ICD-10-CM | POA: Diagnosis not present

## 2019-06-22 DIAGNOSIS — Z951 Presence of aortocoronary bypass graft: Secondary | ICD-10-CM | POA: Diagnosis not present

## 2019-06-22 DIAGNOSIS — I110 Hypertensive heart disease with heart failure: Secondary | ICD-10-CM | POA: Diagnosis not present

## 2019-06-22 DIAGNOSIS — E559 Vitamin D deficiency, unspecified: Secondary | ICD-10-CM | POA: Diagnosis not present

## 2019-06-22 DIAGNOSIS — D509 Iron deficiency anemia, unspecified: Secondary | ICD-10-CM | POA: Diagnosis not present

## 2019-06-22 DIAGNOSIS — J309 Allergic rhinitis, unspecified: Secondary | ICD-10-CM | POA: Diagnosis not present

## 2019-06-22 DIAGNOSIS — Z9181 History of falling: Secondary | ICD-10-CM | POA: Diagnosis not present

## 2019-06-22 DIAGNOSIS — K922 Gastrointestinal hemorrhage, unspecified: Secondary | ICD-10-CM | POA: Diagnosis not present

## 2019-06-22 DIAGNOSIS — E871 Hypo-osmolality and hyponatremia: Secondary | ICD-10-CM | POA: Diagnosis not present

## 2019-06-22 DIAGNOSIS — E1165 Type 2 diabetes mellitus with hyperglycemia: Secondary | ICD-10-CM | POA: Diagnosis not present

## 2019-06-23 DIAGNOSIS — Z951 Presence of aortocoronary bypass graft: Secondary | ICD-10-CM | POA: Diagnosis not present

## 2019-06-23 DIAGNOSIS — Z87440 Personal history of urinary (tract) infections: Secondary | ICD-10-CM | POA: Diagnosis not present

## 2019-06-23 DIAGNOSIS — N3281 Overactive bladder: Secondary | ICD-10-CM | POA: Diagnosis not present

## 2019-06-23 DIAGNOSIS — E871 Hypo-osmolality and hyponatremia: Secondary | ICD-10-CM | POA: Diagnosis not present

## 2019-06-23 DIAGNOSIS — I700 Atherosclerosis of aorta: Secondary | ICD-10-CM | POA: Diagnosis not present

## 2019-06-23 DIAGNOSIS — D509 Iron deficiency anemia, unspecified: Secondary | ICD-10-CM | POA: Diagnosis not present

## 2019-06-23 DIAGNOSIS — K922 Gastrointestinal hemorrhage, unspecified: Secondary | ICD-10-CM | POA: Diagnosis not present

## 2019-06-23 DIAGNOSIS — R188 Other ascites: Secondary | ICD-10-CM | POA: Diagnosis not present

## 2019-06-23 DIAGNOSIS — E782 Mixed hyperlipidemia: Secondary | ICD-10-CM | POA: Diagnosis not present

## 2019-06-23 DIAGNOSIS — E1165 Type 2 diabetes mellitus with hyperglycemia: Secondary | ICD-10-CM | POA: Diagnosis not present

## 2019-06-23 DIAGNOSIS — N179 Acute kidney failure, unspecified: Secondary | ICD-10-CM | POA: Diagnosis not present

## 2019-06-23 DIAGNOSIS — Z9181 History of falling: Secondary | ICD-10-CM | POA: Diagnosis not present

## 2019-06-23 DIAGNOSIS — E559 Vitamin D deficiency, unspecified: Secondary | ICD-10-CM | POA: Diagnosis not present

## 2019-06-23 DIAGNOSIS — Z794 Long term (current) use of insulin: Secondary | ICD-10-CM | POA: Diagnosis not present

## 2019-06-23 DIAGNOSIS — I110 Hypertensive heart disease with heart failure: Secondary | ICD-10-CM | POA: Diagnosis not present

## 2019-06-23 DIAGNOSIS — J309 Allergic rhinitis, unspecified: Secondary | ICD-10-CM | POA: Diagnosis not present

## 2019-06-23 DIAGNOSIS — Z955 Presence of coronary angioplasty implant and graft: Secondary | ICD-10-CM | POA: Diagnosis not present

## 2019-06-23 DIAGNOSIS — I2510 Atherosclerotic heart disease of native coronary artery without angina pectoris: Secondary | ICD-10-CM | POA: Diagnosis not present

## 2019-06-23 DIAGNOSIS — I509 Heart failure, unspecified: Secondary | ICD-10-CM | POA: Diagnosis not present

## 2019-06-27 DIAGNOSIS — E1165 Type 2 diabetes mellitus with hyperglycemia: Secondary | ICD-10-CM | POA: Diagnosis not present

## 2019-06-27 DIAGNOSIS — Z794 Long term (current) use of insulin: Secondary | ICD-10-CM | POA: Diagnosis not present

## 2019-06-27 DIAGNOSIS — E871 Hypo-osmolality and hyponatremia: Secondary | ICD-10-CM | POA: Diagnosis not present

## 2019-06-27 DIAGNOSIS — K922 Gastrointestinal hemorrhage, unspecified: Secondary | ICD-10-CM | POA: Diagnosis not present

## 2019-06-27 DIAGNOSIS — N179 Acute kidney failure, unspecified: Secondary | ICD-10-CM | POA: Diagnosis not present

## 2019-06-27 DIAGNOSIS — E782 Mixed hyperlipidemia: Secondary | ICD-10-CM | POA: Diagnosis not present

## 2019-06-27 DIAGNOSIS — N3281 Overactive bladder: Secondary | ICD-10-CM | POA: Diagnosis not present

## 2019-06-27 DIAGNOSIS — Z9181 History of falling: Secondary | ICD-10-CM | POA: Diagnosis not present

## 2019-06-27 DIAGNOSIS — R188 Other ascites: Secondary | ICD-10-CM | POA: Diagnosis not present

## 2019-06-27 DIAGNOSIS — J309 Allergic rhinitis, unspecified: Secondary | ICD-10-CM | POA: Diagnosis not present

## 2019-06-27 DIAGNOSIS — E559 Vitamin D deficiency, unspecified: Secondary | ICD-10-CM | POA: Diagnosis not present

## 2019-06-27 DIAGNOSIS — I110 Hypertensive heart disease with heart failure: Secondary | ICD-10-CM | POA: Diagnosis not present

## 2019-06-27 DIAGNOSIS — D509 Iron deficiency anemia, unspecified: Secondary | ICD-10-CM | POA: Diagnosis not present

## 2019-06-27 DIAGNOSIS — I2510 Atherosclerotic heart disease of native coronary artery without angina pectoris: Secondary | ICD-10-CM | POA: Diagnosis not present

## 2019-06-27 DIAGNOSIS — I700 Atherosclerosis of aorta: Secondary | ICD-10-CM | POA: Diagnosis not present

## 2019-06-27 DIAGNOSIS — Z87440 Personal history of urinary (tract) infections: Secondary | ICD-10-CM | POA: Diagnosis not present

## 2019-06-27 DIAGNOSIS — I509 Heart failure, unspecified: Secondary | ICD-10-CM | POA: Diagnosis not present

## 2019-06-27 DIAGNOSIS — Z951 Presence of aortocoronary bypass graft: Secondary | ICD-10-CM | POA: Diagnosis not present

## 2019-06-27 DIAGNOSIS — Z955 Presence of coronary angioplasty implant and graft: Secondary | ICD-10-CM | POA: Diagnosis not present

## 2019-06-28 DIAGNOSIS — Z955 Presence of coronary angioplasty implant and graft: Secondary | ICD-10-CM | POA: Diagnosis not present

## 2019-06-28 DIAGNOSIS — E782 Mixed hyperlipidemia: Secondary | ICD-10-CM | POA: Diagnosis not present

## 2019-06-28 DIAGNOSIS — Z9181 History of falling: Secondary | ICD-10-CM | POA: Diagnosis not present

## 2019-06-28 DIAGNOSIS — E871 Hypo-osmolality and hyponatremia: Secondary | ICD-10-CM | POA: Diagnosis not present

## 2019-06-28 DIAGNOSIS — K922 Gastrointestinal hemorrhage, unspecified: Secondary | ICD-10-CM | POA: Diagnosis not present

## 2019-06-28 DIAGNOSIS — N3281 Overactive bladder: Secondary | ICD-10-CM | POA: Diagnosis not present

## 2019-06-28 DIAGNOSIS — Z794 Long term (current) use of insulin: Secondary | ICD-10-CM | POA: Diagnosis not present

## 2019-06-28 DIAGNOSIS — D509 Iron deficiency anemia, unspecified: Secondary | ICD-10-CM | POA: Diagnosis not present

## 2019-06-28 DIAGNOSIS — Z951 Presence of aortocoronary bypass graft: Secondary | ICD-10-CM | POA: Diagnosis not present

## 2019-06-28 DIAGNOSIS — E1165 Type 2 diabetes mellitus with hyperglycemia: Secondary | ICD-10-CM | POA: Diagnosis not present

## 2019-06-28 DIAGNOSIS — J309 Allergic rhinitis, unspecified: Secondary | ICD-10-CM | POA: Diagnosis not present

## 2019-06-28 DIAGNOSIS — R188 Other ascites: Secondary | ICD-10-CM | POA: Diagnosis not present

## 2019-06-28 DIAGNOSIS — I2510 Atherosclerotic heart disease of native coronary artery without angina pectoris: Secondary | ICD-10-CM | POA: Diagnosis not present

## 2019-06-28 DIAGNOSIS — N179 Acute kidney failure, unspecified: Secondary | ICD-10-CM | POA: Diagnosis not present

## 2019-06-28 DIAGNOSIS — I509 Heart failure, unspecified: Secondary | ICD-10-CM | POA: Diagnosis not present

## 2019-06-28 DIAGNOSIS — E559 Vitamin D deficiency, unspecified: Secondary | ICD-10-CM | POA: Diagnosis not present

## 2019-06-28 DIAGNOSIS — I700 Atherosclerosis of aorta: Secondary | ICD-10-CM | POA: Diagnosis not present

## 2019-06-28 DIAGNOSIS — I110 Hypertensive heart disease with heart failure: Secondary | ICD-10-CM | POA: Diagnosis not present

## 2019-06-28 DIAGNOSIS — Z87440 Personal history of urinary (tract) infections: Secondary | ICD-10-CM | POA: Diagnosis not present

## 2019-06-29 ENCOUNTER — Ambulatory Visit: Payer: Medicare Other | Admitting: Cardiology

## 2019-06-29 DIAGNOSIS — I509 Heart failure, unspecified: Secondary | ICD-10-CM | POA: Diagnosis not present

## 2019-06-29 DIAGNOSIS — D509 Iron deficiency anemia, unspecified: Secondary | ICD-10-CM | POA: Diagnosis not present

## 2019-06-29 DIAGNOSIS — Z79899 Other long term (current) drug therapy: Secondary | ICD-10-CM | POA: Diagnosis not present

## 2019-06-29 DIAGNOSIS — I10 Essential (primary) hypertension: Secondary | ICD-10-CM | POA: Diagnosis not present

## 2019-06-29 DIAGNOSIS — E559 Vitamin D deficiency, unspecified: Secondary | ICD-10-CM | POA: Diagnosis not present

## 2019-06-29 DIAGNOSIS — M7989 Other specified soft tissue disorders: Secondary | ICD-10-CM | POA: Diagnosis not present

## 2019-06-29 DIAGNOSIS — E119 Type 2 diabetes mellitus without complications: Secondary | ICD-10-CM | POA: Diagnosis not present

## 2019-06-30 DIAGNOSIS — I700 Atherosclerosis of aorta: Secondary | ICD-10-CM | POA: Diagnosis not present

## 2019-06-30 DIAGNOSIS — E871 Hypo-osmolality and hyponatremia: Secondary | ICD-10-CM | POA: Diagnosis not present

## 2019-06-30 DIAGNOSIS — E782 Mixed hyperlipidemia: Secondary | ICD-10-CM | POA: Diagnosis not present

## 2019-06-30 DIAGNOSIS — I2510 Atherosclerotic heart disease of native coronary artery without angina pectoris: Secondary | ICD-10-CM | POA: Diagnosis not present

## 2019-06-30 DIAGNOSIS — N179 Acute kidney failure, unspecified: Secondary | ICD-10-CM | POA: Diagnosis not present

## 2019-06-30 DIAGNOSIS — R188 Other ascites: Secondary | ICD-10-CM | POA: Diagnosis not present

## 2019-06-30 DIAGNOSIS — J309 Allergic rhinitis, unspecified: Secondary | ICD-10-CM | POA: Diagnosis not present

## 2019-06-30 DIAGNOSIS — I110 Hypertensive heart disease with heart failure: Secondary | ICD-10-CM | POA: Diagnosis not present

## 2019-06-30 DIAGNOSIS — Z9181 History of falling: Secondary | ICD-10-CM | POA: Diagnosis not present

## 2019-06-30 DIAGNOSIS — K922 Gastrointestinal hemorrhage, unspecified: Secondary | ICD-10-CM | POA: Diagnosis not present

## 2019-06-30 DIAGNOSIS — I509 Heart failure, unspecified: Secondary | ICD-10-CM | POA: Diagnosis not present

## 2019-06-30 DIAGNOSIS — Z955 Presence of coronary angioplasty implant and graft: Secondary | ICD-10-CM | POA: Diagnosis not present

## 2019-06-30 DIAGNOSIS — E559 Vitamin D deficiency, unspecified: Secondary | ICD-10-CM | POA: Diagnosis not present

## 2019-06-30 DIAGNOSIS — Z951 Presence of aortocoronary bypass graft: Secondary | ICD-10-CM | POA: Diagnosis not present

## 2019-06-30 DIAGNOSIS — N3281 Overactive bladder: Secondary | ICD-10-CM | POA: Diagnosis not present

## 2019-06-30 DIAGNOSIS — E1165 Type 2 diabetes mellitus with hyperglycemia: Secondary | ICD-10-CM | POA: Diagnosis not present

## 2019-06-30 DIAGNOSIS — Z87440 Personal history of urinary (tract) infections: Secondary | ICD-10-CM | POA: Diagnosis not present

## 2019-06-30 DIAGNOSIS — Z794 Long term (current) use of insulin: Secondary | ICD-10-CM | POA: Diagnosis not present

## 2019-06-30 DIAGNOSIS — D509 Iron deficiency anemia, unspecified: Secondary | ICD-10-CM | POA: Diagnosis not present

## 2019-07-04 DIAGNOSIS — I509 Heart failure, unspecified: Secondary | ICD-10-CM | POA: Diagnosis not present

## 2019-07-04 DIAGNOSIS — I700 Atherosclerosis of aorta: Secondary | ICD-10-CM | POA: Diagnosis not present

## 2019-07-04 DIAGNOSIS — E559 Vitamin D deficiency, unspecified: Secondary | ICD-10-CM | POA: Diagnosis not present

## 2019-07-04 DIAGNOSIS — J309 Allergic rhinitis, unspecified: Secondary | ICD-10-CM | POA: Diagnosis not present

## 2019-07-04 DIAGNOSIS — N179 Acute kidney failure, unspecified: Secondary | ICD-10-CM | POA: Diagnosis not present

## 2019-07-04 DIAGNOSIS — K922 Gastrointestinal hemorrhage, unspecified: Secondary | ICD-10-CM | POA: Diagnosis not present

## 2019-07-04 DIAGNOSIS — Z951 Presence of aortocoronary bypass graft: Secondary | ICD-10-CM | POA: Diagnosis not present

## 2019-07-04 DIAGNOSIS — Z9181 History of falling: Secondary | ICD-10-CM | POA: Diagnosis not present

## 2019-07-04 DIAGNOSIS — Z955 Presence of coronary angioplasty implant and graft: Secondary | ICD-10-CM | POA: Diagnosis not present

## 2019-07-04 DIAGNOSIS — Z794 Long term (current) use of insulin: Secondary | ICD-10-CM | POA: Diagnosis not present

## 2019-07-04 DIAGNOSIS — D509 Iron deficiency anemia, unspecified: Secondary | ICD-10-CM | POA: Diagnosis not present

## 2019-07-04 DIAGNOSIS — R188 Other ascites: Secondary | ICD-10-CM | POA: Diagnosis not present

## 2019-07-04 DIAGNOSIS — N3281 Overactive bladder: Secondary | ICD-10-CM | POA: Diagnosis not present

## 2019-07-04 DIAGNOSIS — E782 Mixed hyperlipidemia: Secondary | ICD-10-CM | POA: Diagnosis not present

## 2019-07-04 DIAGNOSIS — I2510 Atherosclerotic heart disease of native coronary artery without angina pectoris: Secondary | ICD-10-CM | POA: Diagnosis not present

## 2019-07-04 DIAGNOSIS — E1165 Type 2 diabetes mellitus with hyperglycemia: Secondary | ICD-10-CM | POA: Diagnosis not present

## 2019-07-04 DIAGNOSIS — E871 Hypo-osmolality and hyponatremia: Secondary | ICD-10-CM | POA: Diagnosis not present

## 2019-07-04 DIAGNOSIS — Z87440 Personal history of urinary (tract) infections: Secondary | ICD-10-CM | POA: Diagnosis not present

## 2019-07-04 DIAGNOSIS — I110 Hypertensive heart disease with heart failure: Secondary | ICD-10-CM | POA: Diagnosis not present

## 2019-07-07 DIAGNOSIS — Z955 Presence of coronary angioplasty implant and graft: Secondary | ICD-10-CM | POA: Diagnosis not present

## 2019-07-07 DIAGNOSIS — I2510 Atherosclerotic heart disease of native coronary artery without angina pectoris: Secondary | ICD-10-CM | POA: Diagnosis not present

## 2019-07-07 DIAGNOSIS — I509 Heart failure, unspecified: Secondary | ICD-10-CM | POA: Diagnosis not present

## 2019-07-07 DIAGNOSIS — Z9181 History of falling: Secondary | ICD-10-CM | POA: Diagnosis not present

## 2019-07-07 DIAGNOSIS — E782 Mixed hyperlipidemia: Secondary | ICD-10-CM | POA: Diagnosis not present

## 2019-07-07 DIAGNOSIS — K922 Gastrointestinal hemorrhage, unspecified: Secondary | ICD-10-CM | POA: Diagnosis not present

## 2019-07-07 DIAGNOSIS — Z951 Presence of aortocoronary bypass graft: Secondary | ICD-10-CM | POA: Diagnosis not present

## 2019-07-07 DIAGNOSIS — E559 Vitamin D deficiency, unspecified: Secondary | ICD-10-CM | POA: Diagnosis not present

## 2019-07-07 DIAGNOSIS — Z794 Long term (current) use of insulin: Secondary | ICD-10-CM | POA: Diagnosis not present

## 2019-07-07 DIAGNOSIS — E871 Hypo-osmolality and hyponatremia: Secondary | ICD-10-CM | POA: Diagnosis not present

## 2019-07-07 DIAGNOSIS — R188 Other ascites: Secondary | ICD-10-CM | POA: Diagnosis not present

## 2019-07-07 DIAGNOSIS — Z87440 Personal history of urinary (tract) infections: Secondary | ICD-10-CM | POA: Diagnosis not present

## 2019-07-07 DIAGNOSIS — N3281 Overactive bladder: Secondary | ICD-10-CM | POA: Diagnosis not present

## 2019-07-07 DIAGNOSIS — J309 Allergic rhinitis, unspecified: Secondary | ICD-10-CM | POA: Diagnosis not present

## 2019-07-07 DIAGNOSIS — E1165 Type 2 diabetes mellitus with hyperglycemia: Secondary | ICD-10-CM | POA: Diagnosis not present

## 2019-07-07 DIAGNOSIS — D509 Iron deficiency anemia, unspecified: Secondary | ICD-10-CM | POA: Diagnosis not present

## 2019-07-07 DIAGNOSIS — I110 Hypertensive heart disease with heart failure: Secondary | ICD-10-CM | POA: Diagnosis not present

## 2019-07-07 DIAGNOSIS — I700 Atherosclerosis of aorta: Secondary | ICD-10-CM | POA: Diagnosis not present

## 2019-07-07 DIAGNOSIS — N179 Acute kidney failure, unspecified: Secondary | ICD-10-CM | POA: Diagnosis not present

## 2019-07-12 DIAGNOSIS — Z794 Long term (current) use of insulin: Secondary | ICD-10-CM | POA: Diagnosis not present

## 2019-07-12 DIAGNOSIS — I700 Atherosclerosis of aorta: Secondary | ICD-10-CM | POA: Diagnosis not present

## 2019-07-12 DIAGNOSIS — Z743 Need for continuous supervision: Secondary | ICD-10-CM | POA: Diagnosis not present

## 2019-07-12 DIAGNOSIS — E860 Dehydration: Secondary | ICD-10-CM | POA: Diagnosis not present

## 2019-07-12 DIAGNOSIS — K5900 Constipation, unspecified: Secondary | ICD-10-CM | POA: Diagnosis not present

## 2019-07-12 DIAGNOSIS — Z9181 History of falling: Secondary | ICD-10-CM | POA: Diagnosis not present

## 2019-07-12 DIAGNOSIS — K810 Acute cholecystitis: Secondary | ICD-10-CM | POA: Diagnosis not present

## 2019-07-12 DIAGNOSIS — E119 Type 2 diabetes mellitus without complications: Secondary | ICD-10-CM | POA: Diagnosis not present

## 2019-07-12 DIAGNOSIS — E782 Mixed hyperlipidemia: Secondary | ICD-10-CM | POA: Diagnosis not present

## 2019-07-12 DIAGNOSIS — D72819 Decreased white blood cell count, unspecified: Secondary | ICD-10-CM | POA: Diagnosis not present

## 2019-07-12 DIAGNOSIS — E1165 Type 2 diabetes mellitus with hyperglycemia: Secondary | ICD-10-CM | POA: Diagnosis not present

## 2019-07-12 DIAGNOSIS — I110 Hypertensive heart disease with heart failure: Secondary | ICD-10-CM | POA: Diagnosis not present

## 2019-07-12 DIAGNOSIS — I2510 Atherosclerotic heart disease of native coronary artery without angina pectoris: Secondary | ICD-10-CM | POA: Diagnosis not present

## 2019-07-12 DIAGNOSIS — I5020 Unspecified systolic (congestive) heart failure: Secondary | ICD-10-CM | POA: Diagnosis not present

## 2019-07-12 DIAGNOSIS — I10 Essential (primary) hypertension: Secondary | ICD-10-CM

## 2019-07-12 DIAGNOSIS — M7989 Other specified soft tissue disorders: Secondary | ICD-10-CM | POA: Diagnosis not present

## 2019-07-12 DIAGNOSIS — D509 Iron deficiency anemia, unspecified: Secondary | ICD-10-CM | POA: Diagnosis not present

## 2019-07-12 DIAGNOSIS — N289 Disorder of kidney and ureter, unspecified: Secondary | ICD-10-CM | POA: Diagnosis not present

## 2019-07-12 DIAGNOSIS — R188 Other ascites: Secondary | ICD-10-CM | POA: Diagnosis not present

## 2019-07-12 DIAGNOSIS — N179 Acute kidney failure, unspecified: Secondary | ICD-10-CM | POA: Diagnosis not present

## 2019-07-12 DIAGNOSIS — K828 Other specified diseases of gallbladder: Secondary | ICD-10-CM | POA: Diagnosis not present

## 2019-07-12 DIAGNOSIS — I509 Heart failure, unspecified: Secondary | ICD-10-CM | POA: Diagnosis not present

## 2019-07-12 DIAGNOSIS — K922 Gastrointestinal hemorrhage, unspecified: Secondary | ICD-10-CM | POA: Diagnosis not present

## 2019-07-12 DIAGNOSIS — Z951 Presence of aortocoronary bypass graft: Secondary | ICD-10-CM | POA: Diagnosis not present

## 2019-07-12 DIAGNOSIS — E559 Vitamin D deficiency, unspecified: Secondary | ICD-10-CM | POA: Diagnosis not present

## 2019-07-12 DIAGNOSIS — Z955 Presence of coronary angioplasty implant and graft: Secondary | ICD-10-CM | POA: Diagnosis not present

## 2019-07-12 DIAGNOSIS — I447 Left bundle-branch block, unspecified: Secondary | ICD-10-CM | POA: Diagnosis not present

## 2019-07-12 DIAGNOSIS — J309 Allergic rhinitis, unspecified: Secondary | ICD-10-CM | POA: Diagnosis not present

## 2019-07-12 DIAGNOSIS — Z87440 Personal history of urinary (tract) infections: Secondary | ICD-10-CM | POA: Diagnosis not present

## 2019-07-12 DIAGNOSIS — K8020 Calculus of gallbladder without cholecystitis without obstruction: Secondary | ICD-10-CM | POA: Diagnosis not present

## 2019-07-12 DIAGNOSIS — E871 Hypo-osmolality and hyponatremia: Secondary | ICD-10-CM | POA: Diagnosis not present

## 2019-07-12 DIAGNOSIS — N3281 Overactive bladder: Secondary | ICD-10-CM | POA: Diagnosis not present

## 2019-07-12 DIAGNOSIS — J90 Pleural effusion, not elsewhere classified: Secondary | ICD-10-CM | POA: Diagnosis not present

## 2019-07-12 DIAGNOSIS — N390 Urinary tract infection, site not specified: Secondary | ICD-10-CM | POA: Diagnosis not present

## 2019-07-12 DIAGNOSIS — I959 Hypotension, unspecified: Secondary | ICD-10-CM | POA: Diagnosis not present

## 2019-07-13 DIAGNOSIS — I350 Nonrheumatic aortic (valve) stenosis: Secondary | ICD-10-CM | POA: Diagnosis not present

## 2019-07-13 DIAGNOSIS — I5022 Chronic systolic (congestive) heart failure: Secondary | ICD-10-CM | POA: Diagnosis not present

## 2019-07-13 DIAGNOSIS — Z743 Need for continuous supervision: Secondary | ICD-10-CM | POA: Diagnosis not present

## 2019-07-13 DIAGNOSIS — N183 Chronic kidney disease, stage 3 unspecified: Secondary | ICD-10-CM | POA: Diagnosis not present

## 2019-07-13 DIAGNOSIS — I509 Heart failure, unspecified: Secondary | ICD-10-CM | POA: Diagnosis not present

## 2019-07-13 DIAGNOSIS — M1990 Unspecified osteoarthritis, unspecified site: Secondary | ICD-10-CM | POA: Diagnosis not present

## 2019-07-13 DIAGNOSIS — E785 Hyperlipidemia, unspecified: Secondary | ICD-10-CM | POA: Diagnosis not present

## 2019-07-13 DIAGNOSIS — D72818 Other decreased white blood cell count: Secondary | ICD-10-CM | POA: Diagnosis not present

## 2019-07-13 DIAGNOSIS — K7460 Unspecified cirrhosis of liver: Secondary | ICD-10-CM | POA: Diagnosis not present

## 2019-07-13 DIAGNOSIS — I214 Non-ST elevation (NSTEMI) myocardial infarction: Secondary | ICD-10-CM | POA: Diagnosis not present

## 2019-07-13 DIAGNOSIS — E119 Type 2 diabetes mellitus without complications: Secondary | ICD-10-CM | POA: Diagnosis not present

## 2019-07-13 DIAGNOSIS — I110 Hypertensive heart disease with heart failure: Secondary | ICD-10-CM | POA: Diagnosis not present

## 2019-07-13 DIAGNOSIS — I10 Essential (primary) hypertension: Secondary | ICD-10-CM | POA: Diagnosis not present

## 2019-07-13 DIAGNOSIS — E1122 Type 2 diabetes mellitus with diabetic chronic kidney disease: Secondary | ICD-10-CM | POA: Diagnosis not present

## 2019-07-13 DIAGNOSIS — D649 Anemia, unspecified: Secondary | ICD-10-CM | POA: Diagnosis not present

## 2019-07-13 DIAGNOSIS — I2510 Atherosclerotic heart disease of native coronary artery without angina pectoris: Secondary | ICD-10-CM | POA: Diagnosis not present

## 2019-07-13 DIAGNOSIS — R278 Other lack of coordination: Secondary | ICD-10-CM | POA: Diagnosis not present

## 2019-07-13 DIAGNOSIS — R279 Unspecified lack of coordination: Secondary | ICD-10-CM | POA: Diagnosis not present

## 2019-07-13 DIAGNOSIS — R2689 Other abnormalities of gait and mobility: Secondary | ICD-10-CM | POA: Diagnosis not present

## 2019-07-13 DIAGNOSIS — J811 Chronic pulmonary edema: Secondary | ICD-10-CM | POA: Diagnosis not present

## 2019-07-13 DIAGNOSIS — M6281 Muscle weakness (generalized): Secondary | ICD-10-CM | POA: Diagnosis not present

## 2019-07-13 DIAGNOSIS — R1084 Generalized abdominal pain: Secondary | ICD-10-CM | POA: Diagnosis not present

## 2019-07-13 DIAGNOSIS — K8020 Calculus of gallbladder without cholecystitis without obstruction: Secondary | ICD-10-CM | POA: Diagnosis not present

## 2019-07-13 DIAGNOSIS — I429 Cardiomyopathy, unspecified: Secondary | ICD-10-CM | POA: Diagnosis not present

## 2019-07-13 DIAGNOSIS — J90 Pleural effusion, not elsewhere classified: Secondary | ICD-10-CM | POA: Diagnosis not present

## 2019-07-13 DIAGNOSIS — K567 Ileus, unspecified: Secondary | ICD-10-CM | POA: Diagnosis not present

## 2019-07-13 DIAGNOSIS — R748 Abnormal levels of other serum enzymes: Secondary | ICD-10-CM | POA: Diagnosis not present

## 2019-07-13 DIAGNOSIS — J9811 Atelectasis: Secondary | ICD-10-CM | POA: Diagnosis not present

## 2019-07-13 DIAGNOSIS — I252 Old myocardial infarction: Secondary | ICD-10-CM | POA: Diagnosis not present

## 2019-07-13 DIAGNOSIS — Z794 Long term (current) use of insulin: Secondary | ICD-10-CM | POA: Diagnosis not present

## 2019-07-13 DIAGNOSIS — E871 Hypo-osmolality and hyponatremia: Secondary | ICD-10-CM | POA: Diagnosis not present

## 2019-07-13 DIAGNOSIS — I5020 Unspecified systolic (congestive) heart failure: Secondary | ICD-10-CM | POA: Diagnosis not present

## 2019-07-13 DIAGNOSIS — I21A1 Myocardial infarction type 2: Secondary | ICD-10-CM | POA: Diagnosis not present

## 2019-07-13 DIAGNOSIS — I130 Hypertensive heart and chronic kidney disease with heart failure and stage 1 through stage 4 chronic kidney disease, or unspecified chronic kidney disease: Secondary | ICD-10-CM | POA: Diagnosis not present

## 2019-07-13 DIAGNOSIS — I361 Nonrheumatic tricuspid (valve) insufficiency: Secondary | ICD-10-CM | POA: Diagnosis not present

## 2019-07-13 DIAGNOSIS — M818 Other osteoporosis without current pathological fracture: Secondary | ICD-10-CM | POA: Diagnosis not present

## 2019-07-13 DIAGNOSIS — R188 Other ascites: Secondary | ICD-10-CM | POA: Diagnosis not present

## 2019-07-13 DIAGNOSIS — K5900 Constipation, unspecified: Secondary | ICD-10-CM | POA: Diagnosis not present

## 2019-07-13 DIAGNOSIS — K828 Other specified diseases of gallbladder: Secondary | ICD-10-CM | POA: Diagnosis not present

## 2019-07-13 DIAGNOSIS — Z951 Presence of aortocoronary bypass graft: Secondary | ICD-10-CM | POA: Diagnosis not present

## 2019-07-13 DIAGNOSIS — K810 Acute cholecystitis: Secondary | ICD-10-CM | POA: Diagnosis not present

## 2019-07-13 DIAGNOSIS — R195 Other fecal abnormalities: Secondary | ICD-10-CM | POA: Diagnosis not present

## 2019-07-13 DIAGNOSIS — N179 Acute kidney failure, unspecified: Secondary | ICD-10-CM | POA: Diagnosis not present

## 2019-07-13 DIAGNOSIS — R778 Other specified abnormalities of plasma proteins: Secondary | ICD-10-CM | POA: Diagnosis not present

## 2019-07-13 DIAGNOSIS — R339 Retention of urine, unspecified: Secondary | ICD-10-CM | POA: Diagnosis not present

## 2019-07-13 DIAGNOSIS — K8000 Calculus of gallbladder with acute cholecystitis without obstruction: Secondary | ICD-10-CM | POA: Diagnosis not present

## 2019-07-13 DIAGNOSIS — D72819 Decreased white blood cell count, unspecified: Secondary | ICD-10-CM | POA: Diagnosis not present

## 2019-07-13 DIAGNOSIS — J918 Pleural effusion in other conditions classified elsewhere: Secondary | ICD-10-CM | POA: Diagnosis not present

## 2019-07-13 DIAGNOSIS — N170 Acute kidney failure with tubular necrosis: Secondary | ICD-10-CM | POA: Diagnosis not present

## 2019-07-13 DIAGNOSIS — R140 Abdominal distension (gaseous): Secondary | ICD-10-CM | POA: Diagnosis not present

## 2019-07-15 DIAGNOSIS — I429 Cardiomyopathy, unspecified: Secondary | ICD-10-CM

## 2019-07-15 DIAGNOSIS — I5020 Unspecified systolic (congestive) heart failure: Secondary | ICD-10-CM

## 2019-07-15 DIAGNOSIS — E119 Type 2 diabetes mellitus without complications: Secondary | ICD-10-CM

## 2019-07-15 DIAGNOSIS — I2510 Atherosclerotic heart disease of native coronary artery without angina pectoris: Secondary | ICD-10-CM

## 2019-07-15 DIAGNOSIS — R748 Abnormal levels of other serum enzymes: Secondary | ICD-10-CM

## 2019-07-15 DIAGNOSIS — K810 Acute cholecystitis: Secondary | ICD-10-CM

## 2019-07-16 DIAGNOSIS — D649 Anemia, unspecified: Secondary | ICD-10-CM

## 2019-07-17 DIAGNOSIS — N179 Acute kidney failure, unspecified: Secondary | ICD-10-CM

## 2019-07-25 DIAGNOSIS — N183 Chronic kidney disease, stage 3 unspecified: Secondary | ICD-10-CM | POA: Diagnosis not present

## 2019-07-25 DIAGNOSIS — R1084 Generalized abdominal pain: Secondary | ICD-10-CM | POA: Diagnosis not present

## 2019-07-25 DIAGNOSIS — I5020 Unspecified systolic (congestive) heart failure: Secondary | ICD-10-CM | POA: Diagnosis not present

## 2019-07-25 DIAGNOSIS — K810 Acute cholecystitis: Secondary | ICD-10-CM | POA: Diagnosis not present

## 2019-07-25 DIAGNOSIS — K8000 Calculus of gallbladder with acute cholecystitis without obstruction: Secondary | ICD-10-CM | POA: Diagnosis not present

## 2019-07-25 DIAGNOSIS — E785 Hyperlipidemia, unspecified: Secondary | ICD-10-CM | POA: Diagnosis not present

## 2019-07-25 DIAGNOSIS — I2510 Atherosclerotic heart disease of native coronary artery without angina pectoris: Secondary | ICD-10-CM | POA: Diagnosis not present

## 2019-07-25 DIAGNOSIS — D649 Anemia, unspecified: Secondary | ICD-10-CM | POA: Diagnosis not present

## 2019-07-25 DIAGNOSIS — R0602 Shortness of breath: Secondary | ICD-10-CM | POA: Diagnosis not present

## 2019-07-25 DIAGNOSIS — R262 Difficulty in walking, not elsewhere classified: Secondary | ICD-10-CM | POA: Diagnosis not present

## 2019-07-25 DIAGNOSIS — I10 Essential (primary) hypertension: Secondary | ICD-10-CM | POA: Diagnosis not present

## 2019-07-25 DIAGNOSIS — M818 Other osteoporosis without current pathological fracture: Secondary | ICD-10-CM | POA: Diagnosis not present

## 2019-07-25 DIAGNOSIS — Z79899 Other long term (current) drug therapy: Secondary | ICD-10-CM | POA: Diagnosis not present

## 2019-07-25 DIAGNOSIS — R2689 Other abnormalities of gait and mobility: Secondary | ICD-10-CM | POA: Diagnosis not present

## 2019-07-25 DIAGNOSIS — N1831 Chronic kidney disease, stage 3a: Secondary | ICD-10-CM | POA: Diagnosis not present

## 2019-07-25 DIAGNOSIS — M6281 Muscle weakness (generalized): Secondary | ICD-10-CM | POA: Diagnosis not present

## 2019-07-25 DIAGNOSIS — K567 Ileus, unspecified: Secondary | ICD-10-CM | POA: Diagnosis not present

## 2019-07-25 DIAGNOSIS — R279 Unspecified lack of coordination: Secondary | ICD-10-CM | POA: Diagnosis not present

## 2019-07-25 DIAGNOSIS — D72819 Decreased white blood cell count, unspecified: Secondary | ICD-10-CM | POA: Diagnosis not present

## 2019-07-25 DIAGNOSIS — E119 Type 2 diabetes mellitus without complications: Secondary | ICD-10-CM | POA: Diagnosis not present

## 2019-07-25 DIAGNOSIS — Z743 Need for continuous supervision: Secondary | ICD-10-CM | POA: Diagnosis not present

## 2019-07-25 DIAGNOSIS — J918 Pleural effusion in other conditions classified elsewhere: Secondary | ICD-10-CM | POA: Diagnosis not present

## 2019-07-25 DIAGNOSIS — R278 Other lack of coordination: Secondary | ICD-10-CM | POA: Diagnosis not present

## 2019-07-25 DIAGNOSIS — I5022 Chronic systolic (congestive) heart failure: Secondary | ICD-10-CM | POA: Diagnosis not present

## 2019-07-25 DIAGNOSIS — R195 Other fecal abnormalities: Secondary | ICD-10-CM | POA: Diagnosis not present

## 2019-07-25 DIAGNOSIS — R339 Retention of urine, unspecified: Secondary | ICD-10-CM | POA: Diagnosis not present

## 2019-07-25 DIAGNOSIS — K7460 Unspecified cirrhosis of liver: Secondary | ICD-10-CM | POA: Diagnosis not present

## 2019-07-25 DIAGNOSIS — I214 Non-ST elevation (NSTEMI) myocardial infarction: Secondary | ICD-10-CM | POA: Diagnosis not present

## 2019-07-26 DIAGNOSIS — D649 Anemia, unspecified: Secondary | ICD-10-CM | POA: Diagnosis not present

## 2019-07-26 DIAGNOSIS — N1831 Chronic kidney disease, stage 3a: Secondary | ICD-10-CM | POA: Diagnosis not present

## 2019-07-26 DIAGNOSIS — R262 Difficulty in walking, not elsewhere classified: Secondary | ICD-10-CM | POA: Diagnosis not present

## 2019-07-26 DIAGNOSIS — I2510 Atherosclerotic heart disease of native coronary artery without angina pectoris: Secondary | ICD-10-CM | POA: Diagnosis not present

## 2019-08-01 DIAGNOSIS — K8000 Calculus of gallbladder with acute cholecystitis without obstruction: Secondary | ICD-10-CM | POA: Diagnosis not present

## 2019-08-08 DIAGNOSIS — E782 Mixed hyperlipidemia: Secondary | ICD-10-CM | POA: Diagnosis not present

## 2019-08-08 DIAGNOSIS — J309 Allergic rhinitis, unspecified: Secondary | ICD-10-CM | POA: Diagnosis not present

## 2019-08-08 DIAGNOSIS — I5022 Chronic systolic (congestive) heart failure: Secondary | ICD-10-CM | POA: Diagnosis not present

## 2019-08-08 DIAGNOSIS — Z955 Presence of coronary angioplasty implant and graft: Secondary | ICD-10-CM | POA: Diagnosis not present

## 2019-08-08 DIAGNOSIS — I700 Atherosclerosis of aorta: Secondary | ICD-10-CM | POA: Diagnosis not present

## 2019-08-08 DIAGNOSIS — Z794 Long term (current) use of insulin: Secondary | ICD-10-CM | POA: Diagnosis not present

## 2019-08-08 DIAGNOSIS — I130 Hypertensive heart and chronic kidney disease with heart failure and stage 1 through stage 4 chronic kidney disease, or unspecified chronic kidney disease: Secondary | ICD-10-CM | POA: Diagnosis not present

## 2019-08-08 DIAGNOSIS — E1165 Type 2 diabetes mellitus with hyperglycemia: Secondary | ICD-10-CM | POA: Diagnosis not present

## 2019-08-08 DIAGNOSIS — I110 Hypertensive heart disease with heart failure: Secondary | ICD-10-CM | POA: Diagnosis not present

## 2019-08-08 DIAGNOSIS — Z9181 History of falling: Secondary | ICD-10-CM | POA: Diagnosis not present

## 2019-08-08 DIAGNOSIS — N179 Acute kidney failure, unspecified: Secondary | ICD-10-CM | POA: Diagnosis not present

## 2019-08-08 DIAGNOSIS — K922 Gastrointestinal hemorrhage, unspecified: Secondary | ICD-10-CM | POA: Diagnosis not present

## 2019-08-08 DIAGNOSIS — Z48815 Encounter for surgical aftercare following surgery on the digestive system: Secondary | ICD-10-CM | POA: Diagnosis not present

## 2019-08-08 DIAGNOSIS — K7460 Unspecified cirrhosis of liver: Secondary | ICD-10-CM | POA: Diagnosis not present

## 2019-08-08 DIAGNOSIS — Z951 Presence of aortocoronary bypass graft: Secondary | ICD-10-CM | POA: Diagnosis not present

## 2019-08-08 DIAGNOSIS — N183 Chronic kidney disease, stage 3 unspecified: Secondary | ICD-10-CM | POA: Diagnosis not present

## 2019-08-08 DIAGNOSIS — R188 Other ascites: Secondary | ICD-10-CM | POA: Diagnosis not present

## 2019-08-08 DIAGNOSIS — E1122 Type 2 diabetes mellitus with diabetic chronic kidney disease: Secondary | ICD-10-CM | POA: Diagnosis not present

## 2019-08-08 DIAGNOSIS — D509 Iron deficiency anemia, unspecified: Secondary | ICD-10-CM | POA: Diagnosis not present

## 2019-08-08 DIAGNOSIS — I509 Heart failure, unspecified: Secondary | ICD-10-CM | POA: Diagnosis not present

## 2019-08-08 DIAGNOSIS — N3281 Overactive bladder: Secondary | ICD-10-CM | POA: Diagnosis not present

## 2019-08-08 DIAGNOSIS — E871 Hypo-osmolality and hyponatremia: Secondary | ICD-10-CM | POA: Diagnosis not present

## 2019-08-08 DIAGNOSIS — Z87440 Personal history of urinary (tract) infections: Secondary | ICD-10-CM | POA: Diagnosis not present

## 2019-08-08 DIAGNOSIS — E559 Vitamin D deficiency, unspecified: Secondary | ICD-10-CM | POA: Diagnosis not present

## 2019-08-08 DIAGNOSIS — I2510 Atherosclerotic heart disease of native coronary artery without angina pectoris: Secondary | ICD-10-CM | POA: Diagnosis not present

## 2019-08-09 DIAGNOSIS — I509 Heart failure, unspecified: Secondary | ICD-10-CM | POA: Diagnosis not present

## 2019-08-09 DIAGNOSIS — I2510 Atherosclerotic heart disease of native coronary artery without angina pectoris: Secondary | ICD-10-CM | POA: Diagnosis not present

## 2019-08-09 DIAGNOSIS — I130 Hypertensive heart and chronic kidney disease with heart failure and stage 1 through stage 4 chronic kidney disease, or unspecified chronic kidney disease: Secondary | ICD-10-CM | POA: Diagnosis not present

## 2019-08-09 DIAGNOSIS — Z794 Long term (current) use of insulin: Secondary | ICD-10-CM | POA: Diagnosis not present

## 2019-08-09 DIAGNOSIS — Z48815 Encounter for surgical aftercare following surgery on the digestive system: Secondary | ICD-10-CM | POA: Diagnosis not present

## 2019-08-09 DIAGNOSIS — I110 Hypertensive heart disease with heart failure: Secondary | ICD-10-CM | POA: Diagnosis not present

## 2019-08-09 DIAGNOSIS — Z9181 History of falling: Secondary | ICD-10-CM | POA: Diagnosis not present

## 2019-08-09 DIAGNOSIS — E1122 Type 2 diabetes mellitus with diabetic chronic kidney disease: Secondary | ICD-10-CM | POA: Diagnosis not present

## 2019-08-09 DIAGNOSIS — E782 Mixed hyperlipidemia: Secondary | ICD-10-CM | POA: Diagnosis not present

## 2019-08-09 DIAGNOSIS — N179 Acute kidney failure, unspecified: Secondary | ICD-10-CM | POA: Diagnosis not present

## 2019-08-09 DIAGNOSIS — Z87440 Personal history of urinary (tract) infections: Secondary | ICD-10-CM | POA: Diagnosis not present

## 2019-08-09 DIAGNOSIS — R188 Other ascites: Secondary | ICD-10-CM | POA: Diagnosis not present

## 2019-08-09 DIAGNOSIS — Z951 Presence of aortocoronary bypass graft: Secondary | ICD-10-CM | POA: Diagnosis not present

## 2019-08-09 DIAGNOSIS — K922 Gastrointestinal hemorrhage, unspecified: Secondary | ICD-10-CM | POA: Diagnosis not present

## 2019-08-09 DIAGNOSIS — J309 Allergic rhinitis, unspecified: Secondary | ICD-10-CM | POA: Diagnosis not present

## 2019-08-09 DIAGNOSIS — E871 Hypo-osmolality and hyponatremia: Secondary | ICD-10-CM | POA: Diagnosis not present

## 2019-08-09 DIAGNOSIS — E1165 Type 2 diabetes mellitus with hyperglycemia: Secondary | ICD-10-CM | POA: Diagnosis not present

## 2019-08-09 DIAGNOSIS — K7460 Unspecified cirrhosis of liver: Secondary | ICD-10-CM | POA: Diagnosis not present

## 2019-08-09 DIAGNOSIS — Z955 Presence of coronary angioplasty implant and graft: Secondary | ICD-10-CM | POA: Diagnosis not present

## 2019-08-09 DIAGNOSIS — E559 Vitamin D deficiency, unspecified: Secondary | ICD-10-CM | POA: Diagnosis not present

## 2019-08-09 DIAGNOSIS — N3281 Overactive bladder: Secondary | ICD-10-CM | POA: Diagnosis not present

## 2019-08-09 DIAGNOSIS — N183 Chronic kidney disease, stage 3 unspecified: Secondary | ICD-10-CM | POA: Diagnosis not present

## 2019-08-09 DIAGNOSIS — I5022 Chronic systolic (congestive) heart failure: Secondary | ICD-10-CM | POA: Diagnosis not present

## 2019-08-09 DIAGNOSIS — I700 Atherosclerosis of aorta: Secondary | ICD-10-CM | POA: Diagnosis not present

## 2019-08-09 DIAGNOSIS — D509 Iron deficiency anemia, unspecified: Secondary | ICD-10-CM | POA: Diagnosis not present

## 2019-08-10 DIAGNOSIS — K8065 Calculus of gallbladder and bile duct with chronic cholecystitis with obstruction: Secondary | ICD-10-CM | POA: Diagnosis not present

## 2019-08-10 DIAGNOSIS — E119 Type 2 diabetes mellitus without complications: Secondary | ICD-10-CM | POA: Diagnosis not present

## 2019-08-10 DIAGNOSIS — I509 Heart failure, unspecified: Secondary | ICD-10-CM | POA: Diagnosis not present

## 2019-08-10 DIAGNOSIS — Z09 Encounter for follow-up examination after completed treatment for conditions other than malignant neoplasm: Secondary | ICD-10-CM | POA: Diagnosis not present

## 2019-08-10 DIAGNOSIS — E559 Vitamin D deficiency, unspecified: Secondary | ICD-10-CM | POA: Diagnosis not present

## 2019-08-10 DIAGNOSIS — Z79899 Other long term (current) drug therapy: Secondary | ICD-10-CM | POA: Diagnosis not present

## 2019-08-11 DIAGNOSIS — I5022 Chronic systolic (congestive) heart failure: Secondary | ICD-10-CM | POA: Diagnosis not present

## 2019-08-11 DIAGNOSIS — E1165 Type 2 diabetes mellitus with hyperglycemia: Secondary | ICD-10-CM | POA: Diagnosis not present

## 2019-08-11 DIAGNOSIS — N179 Acute kidney failure, unspecified: Secondary | ICD-10-CM | POA: Diagnosis not present

## 2019-08-11 DIAGNOSIS — E782 Mixed hyperlipidemia: Secondary | ICD-10-CM | POA: Diagnosis not present

## 2019-08-11 DIAGNOSIS — Z955 Presence of coronary angioplasty implant and graft: Secondary | ICD-10-CM | POA: Diagnosis not present

## 2019-08-11 DIAGNOSIS — N183 Chronic kidney disease, stage 3 unspecified: Secondary | ICD-10-CM | POA: Diagnosis not present

## 2019-08-11 DIAGNOSIS — I130 Hypertensive heart and chronic kidney disease with heart failure and stage 1 through stage 4 chronic kidney disease, or unspecified chronic kidney disease: Secondary | ICD-10-CM | POA: Diagnosis not present

## 2019-08-11 DIAGNOSIS — Z951 Presence of aortocoronary bypass graft: Secondary | ICD-10-CM | POA: Diagnosis not present

## 2019-08-11 DIAGNOSIS — E871 Hypo-osmolality and hyponatremia: Secondary | ICD-10-CM | POA: Diagnosis not present

## 2019-08-11 DIAGNOSIS — Z87440 Personal history of urinary (tract) infections: Secondary | ICD-10-CM | POA: Diagnosis not present

## 2019-08-11 DIAGNOSIS — I2510 Atherosclerotic heart disease of native coronary artery without angina pectoris: Secondary | ICD-10-CM | POA: Diagnosis not present

## 2019-08-11 DIAGNOSIS — E1122 Type 2 diabetes mellitus with diabetic chronic kidney disease: Secondary | ICD-10-CM | POA: Diagnosis not present

## 2019-08-11 DIAGNOSIS — Z9181 History of falling: Secondary | ICD-10-CM | POA: Diagnosis not present

## 2019-08-11 DIAGNOSIS — I110 Hypertensive heart disease with heart failure: Secondary | ICD-10-CM | POA: Diagnosis not present

## 2019-08-11 DIAGNOSIS — R188 Other ascites: Secondary | ICD-10-CM | POA: Diagnosis not present

## 2019-08-11 DIAGNOSIS — J309 Allergic rhinitis, unspecified: Secondary | ICD-10-CM | POA: Diagnosis not present

## 2019-08-11 DIAGNOSIS — K922 Gastrointestinal hemorrhage, unspecified: Secondary | ICD-10-CM | POA: Diagnosis not present

## 2019-08-11 DIAGNOSIS — I509 Heart failure, unspecified: Secondary | ICD-10-CM | POA: Diagnosis not present

## 2019-08-11 DIAGNOSIS — K7460 Unspecified cirrhosis of liver: Secondary | ICD-10-CM | POA: Diagnosis not present

## 2019-08-11 DIAGNOSIS — Z48815 Encounter for surgical aftercare following surgery on the digestive system: Secondary | ICD-10-CM | POA: Diagnosis not present

## 2019-08-11 DIAGNOSIS — I700 Atherosclerosis of aorta: Secondary | ICD-10-CM | POA: Diagnosis not present

## 2019-08-11 DIAGNOSIS — D509 Iron deficiency anemia, unspecified: Secondary | ICD-10-CM | POA: Diagnosis not present

## 2019-08-11 DIAGNOSIS — Z794 Long term (current) use of insulin: Secondary | ICD-10-CM | POA: Diagnosis not present

## 2019-08-11 DIAGNOSIS — E559 Vitamin D deficiency, unspecified: Secondary | ICD-10-CM | POA: Diagnosis not present

## 2019-08-11 DIAGNOSIS — N3281 Overactive bladder: Secondary | ICD-10-CM | POA: Diagnosis not present

## 2019-08-14 DIAGNOSIS — I2510 Atherosclerotic heart disease of native coronary artery without angina pectoris: Secondary | ICD-10-CM | POA: Diagnosis not present

## 2019-08-14 DIAGNOSIS — Z951 Presence of aortocoronary bypass graft: Secondary | ICD-10-CM | POA: Diagnosis not present

## 2019-08-14 DIAGNOSIS — D509 Iron deficiency anemia, unspecified: Secondary | ICD-10-CM | POA: Diagnosis not present

## 2019-08-14 DIAGNOSIS — E1122 Type 2 diabetes mellitus with diabetic chronic kidney disease: Secondary | ICD-10-CM | POA: Diagnosis not present

## 2019-08-14 DIAGNOSIS — N183 Chronic kidney disease, stage 3 unspecified: Secondary | ICD-10-CM | POA: Diagnosis not present

## 2019-08-14 DIAGNOSIS — I700 Atherosclerosis of aorta: Secondary | ICD-10-CM | POA: Diagnosis not present

## 2019-08-14 DIAGNOSIS — E559 Vitamin D deficiency, unspecified: Secondary | ICD-10-CM | POA: Diagnosis not present

## 2019-08-14 DIAGNOSIS — I5022 Chronic systolic (congestive) heart failure: Secondary | ICD-10-CM | POA: Diagnosis not present

## 2019-08-14 DIAGNOSIS — Z794 Long term (current) use of insulin: Secondary | ICD-10-CM | POA: Diagnosis not present

## 2019-08-14 DIAGNOSIS — Z9181 History of falling: Secondary | ICD-10-CM | POA: Diagnosis not present

## 2019-08-14 DIAGNOSIS — K922 Gastrointestinal hemorrhage, unspecified: Secondary | ICD-10-CM | POA: Diagnosis not present

## 2019-08-14 DIAGNOSIS — I110 Hypertensive heart disease with heart failure: Secondary | ICD-10-CM | POA: Diagnosis not present

## 2019-08-14 DIAGNOSIS — Z87440 Personal history of urinary (tract) infections: Secondary | ICD-10-CM | POA: Diagnosis not present

## 2019-08-14 DIAGNOSIS — E782 Mixed hyperlipidemia: Secondary | ICD-10-CM | POA: Diagnosis not present

## 2019-08-14 DIAGNOSIS — Z48815 Encounter for surgical aftercare following surgery on the digestive system: Secondary | ICD-10-CM | POA: Diagnosis not present

## 2019-08-14 DIAGNOSIS — J309 Allergic rhinitis, unspecified: Secondary | ICD-10-CM | POA: Diagnosis not present

## 2019-08-14 DIAGNOSIS — E871 Hypo-osmolality and hyponatremia: Secondary | ICD-10-CM | POA: Diagnosis not present

## 2019-08-14 DIAGNOSIS — I130 Hypertensive heart and chronic kidney disease with heart failure and stage 1 through stage 4 chronic kidney disease, or unspecified chronic kidney disease: Secondary | ICD-10-CM | POA: Diagnosis not present

## 2019-08-14 DIAGNOSIS — E1165 Type 2 diabetes mellitus with hyperglycemia: Secondary | ICD-10-CM | POA: Diagnosis not present

## 2019-08-14 DIAGNOSIS — R188 Other ascites: Secondary | ICD-10-CM | POA: Diagnosis not present

## 2019-08-14 DIAGNOSIS — Z955 Presence of coronary angioplasty implant and graft: Secondary | ICD-10-CM | POA: Diagnosis not present

## 2019-08-14 DIAGNOSIS — I509 Heart failure, unspecified: Secondary | ICD-10-CM | POA: Diagnosis not present

## 2019-08-14 DIAGNOSIS — N179 Acute kidney failure, unspecified: Secondary | ICD-10-CM | POA: Diagnosis not present

## 2019-08-14 DIAGNOSIS — N3281 Overactive bladder: Secondary | ICD-10-CM | POA: Diagnosis not present

## 2019-08-14 DIAGNOSIS — K7460 Unspecified cirrhosis of liver: Secondary | ICD-10-CM | POA: Diagnosis not present

## 2019-08-15 DIAGNOSIS — J309 Allergic rhinitis, unspecified: Secondary | ICD-10-CM | POA: Diagnosis not present

## 2019-08-15 DIAGNOSIS — K922 Gastrointestinal hemorrhage, unspecified: Secondary | ICD-10-CM | POA: Diagnosis not present

## 2019-08-15 DIAGNOSIS — I130 Hypertensive heart and chronic kidney disease with heart failure and stage 1 through stage 4 chronic kidney disease, or unspecified chronic kidney disease: Secondary | ICD-10-CM | POA: Diagnosis not present

## 2019-08-15 DIAGNOSIS — Z48815 Encounter for surgical aftercare following surgery on the digestive system: Secondary | ICD-10-CM | POA: Diagnosis not present

## 2019-08-15 DIAGNOSIS — N3281 Overactive bladder: Secondary | ICD-10-CM | POA: Diagnosis not present

## 2019-08-15 DIAGNOSIS — N183 Chronic kidney disease, stage 3 unspecified: Secondary | ICD-10-CM | POA: Diagnosis not present

## 2019-08-15 DIAGNOSIS — E871 Hypo-osmolality and hyponatremia: Secondary | ICD-10-CM | POA: Diagnosis not present

## 2019-08-15 DIAGNOSIS — K7460 Unspecified cirrhosis of liver: Secondary | ICD-10-CM | POA: Diagnosis not present

## 2019-08-15 DIAGNOSIS — N179 Acute kidney failure, unspecified: Secondary | ICD-10-CM | POA: Diagnosis not present

## 2019-08-15 DIAGNOSIS — R188 Other ascites: Secondary | ICD-10-CM | POA: Diagnosis not present

## 2019-08-15 DIAGNOSIS — I110 Hypertensive heart disease with heart failure: Secondary | ICD-10-CM | POA: Diagnosis not present

## 2019-08-15 DIAGNOSIS — D509 Iron deficiency anemia, unspecified: Secondary | ICD-10-CM | POA: Diagnosis not present

## 2019-08-15 DIAGNOSIS — Z87440 Personal history of urinary (tract) infections: Secondary | ICD-10-CM | POA: Diagnosis not present

## 2019-08-15 DIAGNOSIS — E782 Mixed hyperlipidemia: Secondary | ICD-10-CM | POA: Diagnosis not present

## 2019-08-15 DIAGNOSIS — Z955 Presence of coronary angioplasty implant and graft: Secondary | ICD-10-CM | POA: Diagnosis not present

## 2019-08-15 DIAGNOSIS — E559 Vitamin D deficiency, unspecified: Secondary | ICD-10-CM | POA: Diagnosis not present

## 2019-08-15 DIAGNOSIS — I700 Atherosclerosis of aorta: Secondary | ICD-10-CM | POA: Diagnosis not present

## 2019-08-15 DIAGNOSIS — E1165 Type 2 diabetes mellitus with hyperglycemia: Secondary | ICD-10-CM | POA: Diagnosis not present

## 2019-08-15 DIAGNOSIS — E1122 Type 2 diabetes mellitus with diabetic chronic kidney disease: Secondary | ICD-10-CM | POA: Diagnosis not present

## 2019-08-15 DIAGNOSIS — Z951 Presence of aortocoronary bypass graft: Secondary | ICD-10-CM | POA: Diagnosis not present

## 2019-08-15 DIAGNOSIS — I5022 Chronic systolic (congestive) heart failure: Secondary | ICD-10-CM | POA: Diagnosis not present

## 2019-08-15 DIAGNOSIS — I2510 Atherosclerotic heart disease of native coronary artery without angina pectoris: Secondary | ICD-10-CM | POA: Diagnosis not present

## 2019-08-15 DIAGNOSIS — I509 Heart failure, unspecified: Secondary | ICD-10-CM | POA: Diagnosis not present

## 2019-08-15 DIAGNOSIS — Z9181 History of falling: Secondary | ICD-10-CM | POA: Diagnosis not present

## 2019-08-15 DIAGNOSIS — Z794 Long term (current) use of insulin: Secondary | ICD-10-CM | POA: Diagnosis not present

## 2019-08-17 DIAGNOSIS — N183 Chronic kidney disease, stage 3 unspecified: Secondary | ICD-10-CM | POA: Diagnosis not present

## 2019-08-17 DIAGNOSIS — E1122 Type 2 diabetes mellitus with diabetic chronic kidney disease: Secondary | ICD-10-CM | POA: Diagnosis not present

## 2019-08-17 DIAGNOSIS — D509 Iron deficiency anemia, unspecified: Secondary | ICD-10-CM | POA: Diagnosis not present

## 2019-08-17 DIAGNOSIS — Z951 Presence of aortocoronary bypass graft: Secondary | ICD-10-CM | POA: Diagnosis not present

## 2019-08-17 DIAGNOSIS — I5022 Chronic systolic (congestive) heart failure: Secondary | ICD-10-CM | POA: Diagnosis not present

## 2019-08-17 DIAGNOSIS — Z9181 History of falling: Secondary | ICD-10-CM | POA: Diagnosis not present

## 2019-08-17 DIAGNOSIS — Z794 Long term (current) use of insulin: Secondary | ICD-10-CM | POA: Diagnosis not present

## 2019-08-17 DIAGNOSIS — E782 Mixed hyperlipidemia: Secondary | ICD-10-CM | POA: Diagnosis not present

## 2019-08-17 DIAGNOSIS — I700 Atherosclerosis of aorta: Secondary | ICD-10-CM | POA: Diagnosis not present

## 2019-08-17 DIAGNOSIS — Z48815 Encounter for surgical aftercare following surgery on the digestive system: Secondary | ICD-10-CM | POA: Diagnosis not present

## 2019-08-17 DIAGNOSIS — I2510 Atherosclerotic heart disease of native coronary artery without angina pectoris: Secondary | ICD-10-CM | POA: Diagnosis not present

## 2019-08-17 DIAGNOSIS — E871 Hypo-osmolality and hyponatremia: Secondary | ICD-10-CM | POA: Diagnosis not present

## 2019-08-17 DIAGNOSIS — J309 Allergic rhinitis, unspecified: Secondary | ICD-10-CM | POA: Diagnosis not present

## 2019-08-17 DIAGNOSIS — K7460 Unspecified cirrhosis of liver: Secondary | ICD-10-CM | POA: Diagnosis not present

## 2019-08-17 DIAGNOSIS — E559 Vitamin D deficiency, unspecified: Secondary | ICD-10-CM | POA: Diagnosis not present

## 2019-08-17 DIAGNOSIS — Z87440 Personal history of urinary (tract) infections: Secondary | ICD-10-CM | POA: Diagnosis not present

## 2019-08-17 DIAGNOSIS — I130 Hypertensive heart and chronic kidney disease with heart failure and stage 1 through stage 4 chronic kidney disease, or unspecified chronic kidney disease: Secondary | ICD-10-CM | POA: Diagnosis not present

## 2019-08-17 DIAGNOSIS — N3281 Overactive bladder: Secondary | ICD-10-CM | POA: Diagnosis not present

## 2019-08-17 DIAGNOSIS — Z955 Presence of coronary angioplasty implant and graft: Secondary | ICD-10-CM | POA: Diagnosis not present

## 2019-08-18 DIAGNOSIS — Z955 Presence of coronary angioplasty implant and graft: Secondary | ICD-10-CM | POA: Diagnosis not present

## 2019-08-18 DIAGNOSIS — J309 Allergic rhinitis, unspecified: Secondary | ICD-10-CM | POA: Diagnosis not present

## 2019-08-18 DIAGNOSIS — N183 Chronic kidney disease, stage 3 unspecified: Secondary | ICD-10-CM | POA: Diagnosis not present

## 2019-08-18 DIAGNOSIS — D509 Iron deficiency anemia, unspecified: Secondary | ICD-10-CM | POA: Diagnosis not present

## 2019-08-18 DIAGNOSIS — Z87440 Personal history of urinary (tract) infections: Secondary | ICD-10-CM | POA: Diagnosis not present

## 2019-08-18 DIAGNOSIS — Z48815 Encounter for surgical aftercare following surgery on the digestive system: Secondary | ICD-10-CM | POA: Diagnosis not present

## 2019-08-18 DIAGNOSIS — E559 Vitamin D deficiency, unspecified: Secondary | ICD-10-CM | POA: Diagnosis not present

## 2019-08-18 DIAGNOSIS — Z794 Long term (current) use of insulin: Secondary | ICD-10-CM | POA: Diagnosis not present

## 2019-08-18 DIAGNOSIS — N3281 Overactive bladder: Secondary | ICD-10-CM | POA: Diagnosis not present

## 2019-08-18 DIAGNOSIS — I2510 Atherosclerotic heart disease of native coronary artery without angina pectoris: Secondary | ICD-10-CM | POA: Diagnosis not present

## 2019-08-18 DIAGNOSIS — Z951 Presence of aortocoronary bypass graft: Secondary | ICD-10-CM | POA: Diagnosis not present

## 2019-08-18 DIAGNOSIS — E782 Mixed hyperlipidemia: Secondary | ICD-10-CM | POA: Diagnosis not present

## 2019-08-18 DIAGNOSIS — I130 Hypertensive heart and chronic kidney disease with heart failure and stage 1 through stage 4 chronic kidney disease, or unspecified chronic kidney disease: Secondary | ICD-10-CM | POA: Diagnosis not present

## 2019-08-18 DIAGNOSIS — I700 Atherosclerosis of aorta: Secondary | ICD-10-CM | POA: Diagnosis not present

## 2019-08-18 DIAGNOSIS — K7460 Unspecified cirrhosis of liver: Secondary | ICD-10-CM | POA: Diagnosis not present

## 2019-08-18 DIAGNOSIS — E1122 Type 2 diabetes mellitus with diabetic chronic kidney disease: Secondary | ICD-10-CM | POA: Diagnosis not present

## 2019-08-18 DIAGNOSIS — E871 Hypo-osmolality and hyponatremia: Secondary | ICD-10-CM | POA: Diagnosis not present

## 2019-08-18 DIAGNOSIS — I5022 Chronic systolic (congestive) heart failure: Secondary | ICD-10-CM | POA: Diagnosis not present

## 2019-08-18 DIAGNOSIS — Z9181 History of falling: Secondary | ICD-10-CM | POA: Diagnosis not present

## 2019-08-21 DIAGNOSIS — E871 Hypo-osmolality and hyponatremia: Secondary | ICD-10-CM | POA: Diagnosis not present

## 2019-08-21 DIAGNOSIS — K7460 Unspecified cirrhosis of liver: Secondary | ICD-10-CM | POA: Diagnosis not present

## 2019-08-21 DIAGNOSIS — I130 Hypertensive heart and chronic kidney disease with heart failure and stage 1 through stage 4 chronic kidney disease, or unspecified chronic kidney disease: Secondary | ICD-10-CM | POA: Diagnosis not present

## 2019-08-21 DIAGNOSIS — E785 Hyperlipidemia, unspecified: Secondary | ICD-10-CM | POA: Diagnosis not present

## 2019-08-21 DIAGNOSIS — E1122 Type 2 diabetes mellitus with diabetic chronic kidney disease: Secondary | ICD-10-CM | POA: Diagnosis not present

## 2019-08-21 DIAGNOSIS — M79601 Pain in right arm: Secondary | ICD-10-CM | POA: Diagnosis not present

## 2019-08-21 DIAGNOSIS — Z955 Presence of coronary angioplasty implant and graft: Secondary | ICD-10-CM | POA: Diagnosis not present

## 2019-08-21 DIAGNOSIS — Z794 Long term (current) use of insulin: Secondary | ICD-10-CM | POA: Diagnosis not present

## 2019-08-21 DIAGNOSIS — Z951 Presence of aortocoronary bypass graft: Secondary | ICD-10-CM | POA: Diagnosis not present

## 2019-08-21 DIAGNOSIS — N179 Acute kidney failure, unspecified: Secondary | ICD-10-CM | POA: Diagnosis not present

## 2019-08-21 DIAGNOSIS — I110 Hypertensive heart disease with heart failure: Secondary | ICD-10-CM | POA: Diagnosis not present

## 2019-08-21 DIAGNOSIS — N183 Chronic kidney disease, stage 3 unspecified: Secondary | ICD-10-CM | POA: Diagnosis not present

## 2019-08-21 DIAGNOSIS — I5022 Chronic systolic (congestive) heart failure: Secondary | ICD-10-CM | POA: Diagnosis not present

## 2019-08-21 DIAGNOSIS — J90 Pleural effusion, not elsewhere classified: Secondary | ICD-10-CM | POA: Diagnosis not present

## 2019-08-21 DIAGNOSIS — I700 Atherosclerosis of aorta: Secondary | ICD-10-CM | POA: Diagnosis not present

## 2019-08-21 DIAGNOSIS — Z48815 Encounter for surgical aftercare following surgery on the digestive system: Secondary | ICD-10-CM | POA: Diagnosis not present

## 2019-08-21 DIAGNOSIS — Z87440 Personal history of urinary (tract) infections: Secondary | ICD-10-CM | POA: Diagnosis not present

## 2019-08-21 DIAGNOSIS — J9811 Atelectasis: Secondary | ICD-10-CM | POA: Diagnosis not present

## 2019-08-21 DIAGNOSIS — E559 Vitamin D deficiency, unspecified: Secondary | ICD-10-CM | POA: Diagnosis not present

## 2019-08-21 DIAGNOSIS — Z9181 History of falling: Secondary | ICD-10-CM | POA: Diagnosis not present

## 2019-08-21 DIAGNOSIS — I2510 Atherosclerotic heart disease of native coronary artery without angina pectoris: Secondary | ICD-10-CM | POA: Diagnosis not present

## 2019-08-21 DIAGNOSIS — J309 Allergic rhinitis, unspecified: Secondary | ICD-10-CM | POA: Diagnosis not present

## 2019-08-21 DIAGNOSIS — E782 Mixed hyperlipidemia: Secondary | ICD-10-CM | POA: Diagnosis not present

## 2019-08-21 DIAGNOSIS — I509 Heart failure, unspecified: Secondary | ICD-10-CM | POA: Diagnosis not present

## 2019-08-21 DIAGNOSIS — N3281 Overactive bladder: Secondary | ICD-10-CM | POA: Diagnosis not present

## 2019-08-21 DIAGNOSIS — D509 Iron deficiency anemia, unspecified: Secondary | ICD-10-CM | POA: Diagnosis not present

## 2019-08-22 ENCOUNTER — Inpatient Hospital Stay (HOSPITAL_COMMUNITY): Payer: Medicare Other

## 2019-08-22 ENCOUNTER — Inpatient Hospital Stay (HOSPITAL_COMMUNITY)
Admission: AD | Admit: 2019-08-22 | Discharge: 2019-08-27 | DRG: 291 | Disposition: A | Discharge status: Hospice | Payer: Medicare Other | Source: Other Acute Inpatient Hospital | Attending: Internal Medicine | Admitting: Internal Medicine

## 2019-08-22 DIAGNOSIS — I071 Rheumatic tricuspid insufficiency: Secondary | ICD-10-CM | POA: Diagnosis present

## 2019-08-22 DIAGNOSIS — N179 Acute kidney failure, unspecified: Secondary | ICD-10-CM | POA: Diagnosis present

## 2019-08-22 DIAGNOSIS — N185 Chronic kidney disease, stage 5: Secondary | ICD-10-CM | POA: Diagnosis not present

## 2019-08-22 DIAGNOSIS — I5021 Acute systolic (congestive) heart failure: Secondary | ICD-10-CM | POA: Diagnosis not present

## 2019-08-22 DIAGNOSIS — Z880 Allergy status to penicillin: Secondary | ICD-10-CM

## 2019-08-22 DIAGNOSIS — N183 Chronic kidney disease, stage 3 unspecified: Secondary | ICD-10-CM | POA: Diagnosis not present

## 2019-08-22 DIAGNOSIS — K810 Acute cholecystitis: Secondary | ICD-10-CM | POA: Diagnosis present

## 2019-08-22 DIAGNOSIS — K5900 Constipation, unspecified: Secondary | ICD-10-CM | POA: Diagnosis not present

## 2019-08-22 DIAGNOSIS — R001 Bradycardia, unspecified: Secondary | ICD-10-CM | POA: Diagnosis not present

## 2019-08-22 DIAGNOSIS — E039 Hypothyroidism, unspecified: Secondary | ICD-10-CM | POA: Diagnosis not present

## 2019-08-22 DIAGNOSIS — Z66 Do not resuscitate: Secondary | ICD-10-CM | POA: Diagnosis not present

## 2019-08-22 DIAGNOSIS — I10 Essential (primary) hypertension: Secondary | ICD-10-CM | POA: Diagnosis not present

## 2019-08-22 DIAGNOSIS — R410 Disorientation, unspecified: Secondary | ICD-10-CM | POA: Diagnosis not present

## 2019-08-22 DIAGNOSIS — Z79899 Other long term (current) drug therapy: Secondary | ICD-10-CM

## 2019-08-22 DIAGNOSIS — M25559 Pain in unspecified hip: Secondary | ICD-10-CM | POA: Diagnosis not present

## 2019-08-22 DIAGNOSIS — J9621 Acute and chronic respiratory failure with hypoxia: Secondary | ICD-10-CM | POA: Diagnosis present

## 2019-08-22 DIAGNOSIS — I959 Hypotension, unspecified: Secondary | ICD-10-CM | POA: Diagnosis not present

## 2019-08-22 DIAGNOSIS — Z794 Long term (current) use of insulin: Secondary | ICD-10-CM

## 2019-08-22 DIAGNOSIS — Z888 Allergy status to other drugs, medicaments and biological substances status: Secondary | ICD-10-CM

## 2019-08-22 DIAGNOSIS — Z515 Encounter for palliative care: Secondary | ICD-10-CM

## 2019-08-22 DIAGNOSIS — M6281 Muscle weakness (generalized): Secondary | ICD-10-CM

## 2019-08-22 DIAGNOSIS — I499 Cardiac arrhythmia, unspecified: Secondary | ICD-10-CM | POA: Diagnosis not present

## 2019-08-22 DIAGNOSIS — E875 Hyperkalemia: Secondary | ICD-10-CM | POA: Diagnosis not present

## 2019-08-22 DIAGNOSIS — I2510 Atherosclerotic heart disease of native coronary artery without angina pectoris: Secondary | ICD-10-CM | POA: Diagnosis not present

## 2019-08-22 DIAGNOSIS — I5023 Acute on chronic systolic (congestive) heart failure: Secondary | ICD-10-CM | POA: Diagnosis not present

## 2019-08-22 DIAGNOSIS — N2889 Other specified disorders of kidney and ureter: Secondary | ICD-10-CM | POA: Diagnosis not present

## 2019-08-22 DIAGNOSIS — I132 Hypertensive heart and chronic kidney disease with heart failure and with stage 5 chronic kidney disease, or end stage renal disease: Principal | ICD-10-CM | POA: Diagnosis present

## 2019-08-22 DIAGNOSIS — R748 Abnormal levels of other serum enzymes: Secondary | ICD-10-CM | POA: Diagnosis present

## 2019-08-22 DIAGNOSIS — R918 Other nonspecific abnormal finding of lung field: Secondary | ICD-10-CM | POA: Diagnosis not present

## 2019-08-22 DIAGNOSIS — F0390 Unspecified dementia without behavioral disturbance: Secondary | ICD-10-CM | POA: Diagnosis present

## 2019-08-22 DIAGNOSIS — I110 Hypertensive heart disease with heart failure: Secondary | ICD-10-CM | POA: Diagnosis not present

## 2019-08-22 DIAGNOSIS — I25810 Atherosclerosis of coronary artery bypass graft(s) without angina pectoris: Secondary | ICD-10-CM | POA: Diagnosis not present

## 2019-08-22 DIAGNOSIS — Z20822 Contact with and (suspected) exposure to covid-19: Secondary | ICD-10-CM | POA: Diagnosis present

## 2019-08-22 DIAGNOSIS — E871 Hypo-osmolality and hyponatremia: Secondary | ICD-10-CM | POA: Diagnosis present

## 2019-08-22 DIAGNOSIS — J9811 Atelectasis: Secondary | ICD-10-CM | POA: Diagnosis not present

## 2019-08-22 DIAGNOSIS — E1122 Type 2 diabetes mellitus with diabetic chronic kidney disease: Secondary | ICD-10-CM | POA: Diagnosis present

## 2019-08-22 DIAGNOSIS — K219 Gastro-esophageal reflux disease without esophagitis: Secondary | ICD-10-CM | POA: Diagnosis present

## 2019-08-22 DIAGNOSIS — E785 Hyperlipidemia, unspecified: Secondary | ICD-10-CM | POA: Diagnosis present

## 2019-08-22 DIAGNOSIS — J811 Chronic pulmonary edema: Secondary | ICD-10-CM | POA: Diagnosis not present

## 2019-08-22 DIAGNOSIS — I5043 Acute on chronic combined systolic (congestive) and diastolic (congestive) heart failure: Secondary | ICD-10-CM | POA: Diagnosis present

## 2019-08-22 DIAGNOSIS — Z90710 Acquired absence of both cervix and uterus: Secondary | ICD-10-CM

## 2019-08-22 DIAGNOSIS — I2729 Other secondary pulmonary hypertension: Secondary | ICD-10-CM | POA: Diagnosis present

## 2019-08-22 DIAGNOSIS — I255 Ischemic cardiomyopathy: Secondary | ICD-10-CM | POA: Diagnosis present

## 2019-08-22 DIAGNOSIS — Z7401 Bed confinement status: Secondary | ICD-10-CM | POA: Diagnosis not present

## 2019-08-22 DIAGNOSIS — D509 Iron deficiency anemia, unspecified: Secondary | ICD-10-CM | POA: Diagnosis present

## 2019-08-22 DIAGNOSIS — Z743 Need for continuous supervision: Secondary | ICD-10-CM | POA: Diagnosis not present

## 2019-08-22 DIAGNOSIS — J90 Pleural effusion, not elsewhere classified: Secondary | ICD-10-CM | POA: Diagnosis not present

## 2019-08-22 DIAGNOSIS — M2550 Pain in unspecified joint: Secondary | ICD-10-CM | POA: Diagnosis not present

## 2019-08-22 DIAGNOSIS — Z8249 Family history of ischemic heart disease and other diseases of the circulatory system: Secondary | ICD-10-CM

## 2019-08-22 DIAGNOSIS — Q600 Renal agenesis, unilateral: Secondary | ICD-10-CM | POA: Diagnosis not present

## 2019-08-22 DIAGNOSIS — I509 Heart failure, unspecified: Secondary | ICD-10-CM | POA: Diagnosis not present

## 2019-08-22 DIAGNOSIS — Z7189 Other specified counseling: Secondary | ICD-10-CM | POA: Diagnosis not present

## 2019-08-22 DIAGNOSIS — J9690 Respiratory failure, unspecified, unspecified whether with hypoxia or hypercapnia: Secondary | ICD-10-CM | POA: Diagnosis not present

## 2019-08-22 DIAGNOSIS — Z823 Family history of stroke: Secondary | ICD-10-CM

## 2019-08-22 DIAGNOSIS — Z833 Family history of diabetes mellitus: Secondary | ICD-10-CM

## 2019-08-22 HISTORY — DX: Chronic systolic (congestive) heart failure: I50.22

## 2019-08-22 HISTORY — DX: Unspecified dementia, unspecified severity, without behavioral disturbance, psychotic disturbance, mood disturbance, and anxiety: F03.90

## 2019-08-22 LAB — URINALYSIS, ROUTINE W REFLEX MICROSCOPIC
Bilirubin Urine: NEGATIVE
Glucose, UA: 50 mg/dL — AB
Ketones, ur: NEGATIVE mg/dL
Nitrite: NEGATIVE
Protein, ur: 30 mg/dL — AB
RBC / HPF: >50 RBC/hpf — ABNORMAL HIGH (ref 0–5)
Specific Gravity, Urine: 1.006 (ref 1.005–1.030)
pH: 5 (ref 5.0–8.0)

## 2019-08-22 LAB — BASIC METABOLIC PANEL
Anion gap: 13 (ref 5–15)
BUN: 86 mg/dL — ABNORMAL HIGH (ref 8–23)
CO2: 22 mmol/L (ref 22–32)
Calcium: 8.8 mg/dL — ABNORMAL LOW (ref 8.9–10.3)
Chloride: 90 mmol/L — ABNORMAL LOW (ref 98–111)
Creatinine, Ser: 3.05 mg/dL — ABNORMAL HIGH (ref 0.44–1.00)
GFR calc Af Amer: 16 mL/min — ABNORMAL LOW (ref >60)
GFR calc non Af Amer: 14 mL/min — ABNORMAL LOW (ref >60)
Glucose, Bld: 122 mg/dL — ABNORMAL HIGH (ref 70–99)
Potassium: 5.2 mmol/L — ABNORMAL HIGH (ref 3.5–5.1)
Sodium: 125 mmol/L — ABNORMAL LOW (ref 135–145)

## 2019-08-22 LAB — GLUCOSE, CAPILLARY: Glucose-Capillary: 130 mg/dL — ABNORMAL HIGH (ref 70–99)

## 2019-08-22 LAB — MAGNESIUM: Magnesium: 3 mg/dL — ABNORMAL HIGH (ref 1.7–2.4)

## 2019-08-22 MED ORDER — FUROSEMIDE 10 MG/ML IJ SOLN
40.0000 mg | Freq: Two times a day (BID) | INTRAMUSCULAR | Status: DC
  Administered 2019-08-22 – 2019-08-23 (×2): 40 mg via INTRAVENOUS
  Filled 2019-08-22 (×2): qty 4

## 2019-08-22 MED ORDER — SODIUM CHLORIDE 0.9% FLUSH
3.0000 mL | INTRAVENOUS | Status: DC | PRN
  Administered 2019-08-22: 3 mL via INTRAVENOUS

## 2019-08-22 MED ORDER — CARVEDILOL 3.125 MG PO TABS
3.1250 mg | ORAL_TABLET | Freq: Every day | ORAL | Status: DC
  Administered 2019-08-22 – 2019-08-23 (×2): 3.125 mg via ORAL
  Filled 2019-08-22 (×2): qty 1

## 2019-08-22 MED ORDER — MAGNESIUM OXIDE 400 (241.3 MG) MG PO TABS
400.0000 mg | ORAL_TABLET | Freq: Two times a day (BID) | ORAL | Status: DC
  Administered 2019-08-22 – 2019-08-23 (×2): 400 mg via ORAL
  Filled 2019-08-22 (×2): qty 1

## 2019-08-22 MED ORDER — ATORVASTATIN CALCIUM 40 MG PO TABS
40.0000 mg | ORAL_TABLET | Freq: Every day | ORAL | Status: DC
  Administered 2019-08-22 – 2019-08-27 (×5): 40 mg via ORAL
  Filled 2019-08-22 (×6): qty 1

## 2019-08-22 MED ORDER — OMEGA-3 FISH OIL 1000 MG PO CAPS: 1.0000 | ORAL_CAPSULE | Freq: Every day | ORAL | Status: DC

## 2019-08-22 MED ORDER — ONDANSETRON HCL 4 MG/2ML IJ SOLN
4.0000 mg | Freq: Four times a day (QID) | INTRAMUSCULAR | Status: DC | PRN
  Administered 2019-08-24: 4 mg via INTRAVENOUS
  Filled 2019-08-22: qty 2

## 2019-08-22 MED ORDER — ACETAMINOPHEN 325 MG PO TABS
650.0000 mg | ORAL_TABLET | Freq: Four times a day (QID) | ORAL | Status: DC | PRN
  Administered 2019-08-22 – 2019-08-26 (×4): 650 mg via ORAL
  Filled 2019-08-22 (×4): qty 2

## 2019-08-22 MED ORDER — SODIUM CHLORIDE 0.9 % IV SOLN: 250.0000 mL | INTRAVENOUS | Status: DC | PRN

## 2019-08-22 MED ORDER — INSULIN GLARGINE 100 UNIT/ML ~~LOC~~ SOLN
10.0000 [IU] | Freq: Every day | SUBCUTANEOUS | Status: DC
  Administered 2019-08-23 – 2019-08-26 (×4): 10 [IU] via SUBCUTANEOUS
  Filled 2019-08-22 (×6): qty 0.1

## 2019-08-22 MED ORDER — INSULIN ASPART 100 UNIT/ML ~~LOC~~ SOLN
0.0000 [IU] | Freq: Three times a day (TID) | SUBCUTANEOUS | Status: DC
  Administered 2019-08-23 (×2): 2 [IU] via SUBCUTANEOUS
  Administered 2019-08-23 – 2019-08-25 (×3): 1 [IU] via SUBCUTANEOUS

## 2019-08-22 MED ORDER — FERROUS SULFATE 325 (65 FE) MG PO TABS
325.0000 mg | ORAL_TABLET | Freq: Every day | ORAL | Status: DC
  Administered 2019-08-23 – 2019-08-27 (×5): 325 mg via ORAL
  Filled 2019-08-22 (×5): qty 1

## 2019-08-22 MED ORDER — OMEGA-3-ACID ETHYL ESTERS 1 G PO CAPS
1.0000 g | ORAL_CAPSULE | Freq: Every day | ORAL | Status: DC
  Administered 2019-08-23 – 2019-08-27 (×4): 1 g via ORAL
  Filled 2019-08-22 (×6): qty 1

## 2019-08-22 MED ORDER — HYDRALAZINE HCL 10 MG PO TABS
10.0000 mg | ORAL_TABLET | Freq: Three times a day (TID) | ORAL | Status: DC
  Administered 2019-08-22 – 2019-08-23 (×2): 10 mg via ORAL
  Filled 2019-08-22 (×2): qty 1

## 2019-08-22 MED ORDER — HEPARIN SODIUM (PORCINE) 5000 UNIT/ML IJ SOLN
5000.0000 [IU] | Freq: Two times a day (BID) | INTRAMUSCULAR | Status: DC
  Administered 2019-08-22 – 2019-08-26 (×9): 5000 [IU] via SUBCUTANEOUS
  Filled 2019-08-22 (×9): qty 1

## 2019-08-22 MED ORDER — ISOSORBIDE MONONITRATE ER 30 MG PO TB24
15.0000 mg | ORAL_TABLET | Freq: Every day | ORAL | Status: DC
  Administered 2019-08-23: 15 mg via ORAL
  Filled 2019-08-22: qty 1

## 2019-08-22 MED ORDER — SODIUM CHLORIDE 0.9% FLUSH
3.0000 mL | Freq: Two times a day (BID) | INTRAVENOUS | Status: DC
  Administered 2019-08-22 – 2019-08-27 (×8): 3 mL via INTRAVENOUS

## 2019-08-22 NOTE — Progress Notes (Signed)
Patient is alert and oriented to place and self. Follows commands. Skin assessment completed with Nicholes Rough RN. Bilateral Lower extremity ulcers noted, areas cleansed and wet to dry dressing applied. Wound consult placed. Right percutaneous drain noted to right lateral abdomen. No dressing in place, is sutured in. Dry dressing applied to insertion site. Patient unable to tolerate laying flat. HOB elevated. Orders noted for medications, will administer, lasix included. Foley catheter in place. Will continue to monitor.

## 2019-08-22 NOTE — H&P (Addendum)
History and Physical    Alicia Collins QJJ:[id number] DOB: 1941/02/01 DOA: 08/22/2019  PCP: Nicholos Johns, MD (Confirm with patient/family/NH records and if not entered, this has to be entered at [facility] point of entry) Patient coming from: [place]  I have personally briefly reviewed patient's old medical records in [place]  Chief Complaint: SOB and swelling  HPI: Alicia Collins is a 79 y.o. female with medical history significant of chronic systolic CHF (LVEF 35 to 48%), CAD, chronic iron deficiency anemia, CKD stage III, IDDM, hypertension, mild dementia, presented with worsening of shortness of breath and bilateral leg swelling for more than a month.  Denies any chest pain, initially shortness of breath was exertional, but increasingly becomes more frequent and also develop orthopnea.  [facility] was found to be in acute CHF decompensation with x-ray showing lung congestion with right-sided pleural effusion, creatinine increased from 1.5 last month to 2.6 yesterday.  Sodium level yesterday was 123 compared to 134 last month, potassium 5.3.  Patient was given 60 mg IV Lasix and transferred to [facility] for management of CHF.  Patient denied any chest pain, no cough, no fever chills.  Review of Systems: As per HPI otherwise 10 point review of systems negative.    Past Medical History:  Diagnosis Date  . Coronary artery disease involving coronary bypass graft of native heart without angina pectoris 08/14/2014  . Diabetes ([place])   . Dyslipidemia 08/14/2014  . Essential hypertension 08/14/2014  . GERD (gastroesophageal reflux disease)   . Hypertension     Past Surgical History:  Procedure Laterality Date  . ABDOMINAL HYSTERECTOMY    . ANGIOPLASTY    . CARDIAC CATHETERIZATION    . CORONARY ARTERY BYPASS GRAFT       reports that she has never smoked. She has never used smokeless tobacco. She reports that she does not use drugs. No history on file for  alcohol use.  Allergies  Allergen Reactions  . Rosuvastatin   . Penicillins Rash    Family History  Problem Relation Age of Onset  . Diabetes Father   . Stroke Father   . Heart disease Father      Prior to Admission medications   Medication Sig Start Date End Date Taking? Authorizing Provider  acetaminophen (TYLENOL) 325 MG tablet Take 650 mg by mouth every 6 (six) hours as needed.    [provider]  carvedilol (COREG) 6.25 MG tablet Take 1 tablet (6.25 mg total) by mouth daily. 05/25/19   Tobb, Kardie, DO  ferrous sulfate 325 (65 FE) MG tablet Take 325 mg by mouth daily. 05/15/19   [provider]  furosemide (LASIX) 20 MG tablet Take by mouth.    [provider]  Glucosamine-Chondroitin (GLUCOSAMINE CHONDR COMPLEX PO) Take by mouth.    [provider]  INVOKANA 300 MG TABS tablet  01/25/19   [provider]  lisinopril (ZESTRIL) 40 MG tablet Take by mouth.    [provider]  magnesium oxide (MAG-OX) 400 (241.3 Mg) MG tablet Take 1 tablet by mouth 2 (two) times daily. 02/20/19   [provider]  metFORMIN (GLUCOPHAGE) 500 MG tablet Take by mouth.    [provider]  nitroGLYCERIN (NITROSTAT) 0.4 MG SL tablet Place under the tongue. 06/26/16   [provider]  Omega-3 Fatty Acids (OMEGA-3 FISH OIL PO) Take by mouth.    [provider]  simvastatin (ZOCOR) 80 MG tablet Take by mouth.  [provider]  TOUJEO SOLOSTAR 300 UNIT/ML Solostar Pen INJECT 20 UNITS SUBCUTANEOUSLY DAILY. INCREASE DOSE BY 1 UNIT DAILY UNTIL FASTING BLOOD SUGAR IS AROUND 120. (MAX DAILY DOSE OF 50 UNITS) 05/09/19   [provider]    Physical Exam: Vitals:   08/22/19 1827  BP: (!) 145/66  Pulse: 67  Temp: (!) 97.4 F (36.3 C)  TempSrc: Oral  SpO2: 96%  Weight: 79.1 kg    Constitutional: NAD, calm, comfortable Vitals:   08/22/19 1827  BP: (!) 145/66  Pulse: 67  Temp: (!) 97.4 F (36.3 C)    TempSrc: Oral  SpO2: 96%  Weight: 79.1 kg   Eyes: PERRL, lids and conjunctivae normal ENMT: Mucous membranes are moist. Posterior pharynx clear of any exudate or lesions.Normal dentition.  Neck: normal, supple, no masses, no thyromegaly, JVD about 7 to 8 cm above clavicles Respiratory: clear to auscultation bilaterally, no wheezing, bilateral fine crackles to the mid level of the lungs. Normal respiratory effort. No accessory muscle use.  Cardiovascular: Regular rate and rhythm, no murmurs / rubs / gallops.  Anasarca to lower abdominal wall and sacral area. 2+ pedal pulses. No carotid bruits.  Abdomen: no tenderness, no masses palpated. No hepatosplenomegaly. Bowel sounds positive.  Musculoskeletal: no clubbing / cyanosis. No joint deformity upper and lower extremities. Good ROM, no contractures. Normal muscle tone.  Skin: no rashes, lesions, ulcers. No induration Neurologic: CN 2-12 grossly intact. Sensation intact, DTR normal. Strength 5/5 in all 4.  Psychiatric: Normal judgment and insight. Alert and oriented x 3. Normal mood.     Labs on Admission: I have personally reviewed following labs and imaging studies  CBC: No results for input(s): WBC, NEUTROABS, HGB, HCT, MCV, PLT in the last 168 hours. Basic Metabolic Panel: No results for input(s): NA, K, CL, CO2, GLUCOSE, BUN, CREATININE, CALCIUM, MG, PHOS in the last 168 hours. GFR: CrCl cannot be calculated (No successful lab value found.). Liver Function Tests: No results for input(s): AST, ALT, ALKPHOS, BILITOT, PROT, ALBUMIN in the last 168 hours. No results for input(s): LIPASE, AMYLASE in the last 168 hours. No results for input(s): AMMONIA in the last 168 hours. Coagulation Profile: No results for input(s): INR, PROTIME in the last 168 hours. Cardiac Enzymes: No results for input(s): CKTOTAL, CKMB, CKMBINDEX, TROPONINI in the last 168 hours. BNP (last 3 results) No results for input(s): PROBNP in the last 8760  hours. HbA1C: No results for input(s): HGBA1C in the last 72 hours. CBG: No results for input(s): GLUCAP in the last 168 hours. Lipid Profile: No results for input(s): CHOL, HDL, LDLCALC, TRIG, CHOLHDL, LDLDIRECT in the last 72 hours. Thyroid Function Tests: No results for input(s): TSH, T4TOTAL, FREET4, T3FREE, THYROIDAB in the last 72 hours. Anemia Panel: No results for input(s): VITAMINB12, FOLATE, FERRITIN, TIBC, IRON, RETICCTPCT in the last 72 hours. Urine analysis: No results found for: COLORURINE, APPEARANCEUR, LABSPEC, PHURINE, GLUCOSEU, HGBUR, BILIRUBINUR, KETONESUR, PROTEINUR, UROBILINOGEN, NITRITE, LEUKOCYTESUR  Radiological Exams on Admission: No results found.  EKG: Independently reviewed.  No acute ST-T changes  Assessment/Plan Active Problems:   Acute on chronic systolic CHF (congestive heart failure) (HCC)   CHF (congestive heart failure) ([place])  (please populate well all problems here in Problem List. (For example, if patient is on BP meds at home and you resume or decide to hold them, it is a problem that needs to be her. Same for CAD, COPD, HLD and so on)   Acute on chronic systolic CHF decompensation -Clearly has  signs of fluid overload, Lasix 40 mg IV twice daily -Daily BMP mag, fluid restriction, daily weight and I&O's -Repeat echo  AKI on CKD stage III -Clinically suspect cardiorenal syndrome, as BUN level also increased -Now has fluid overload, continue aggressive diuresis today and tomorrow -Check UA and Renal U/S  Uncontrolled hypertension -Cut down her Coreg given borderline bradycardia, and allow more diuresis -Start hydralazine and Imdur regimen and titrate according to blood pressure response  Hyponatremia, hypervolemic -Likely from fluid overload from CHF, continue Lasix twice daily -Check BMP tonight and then tomorrow, avoid abrupt correction -Prognosis guarded given hyponatremia in the setting of CHF decompensation  IDDM -Hold  Metformin -Cut down long-acting given AKI, start sliding scale  Chronic iron deficiency anemia -Continue iron supplement  Recent acute cholecystitis status post cholecystostomy -Wound care, no acute issue otherwise  DVT prophylaxis: Heparin subcu Code Status: Full code Family Communication: None at bedside Disposition Plan: Patient has complicated medical conditions, severe fluid overload may need more aggressive diuresis, will need more than 2 midnight hospital stay Consults called: None Admission status: tele admit   Lequita Halt MD [facility] Pager [phone]   08/22/2019, 8:04 PM

## 2019-08-23 ENCOUNTER — Inpatient Hospital Stay (HOSPITAL_COMMUNITY): Payer: Medicare Other

## 2019-08-23 ENCOUNTER — Encounter (HOSPITAL_COMMUNITY): Payer: Self-pay | Admitting: Internal Medicine

## 2019-08-23 DIAGNOSIS — N179 Acute kidney failure, unspecified: Secondary | ICD-10-CM

## 2019-08-23 DIAGNOSIS — E1122 Type 2 diabetes mellitus with diabetic chronic kidney disease: Secondary | ICD-10-CM | POA: Diagnosis present

## 2019-08-23 DIAGNOSIS — I5043 Acute on chronic combined systolic (congestive) and diastolic (congestive) heart failure: Secondary | ICD-10-CM

## 2019-08-23 DIAGNOSIS — N183 Chronic kidney disease, stage 3 unspecified: Secondary | ICD-10-CM

## 2019-08-23 DIAGNOSIS — Z66 Do not resuscitate: Secondary | ICD-10-CM | POA: Diagnosis present

## 2019-08-23 DIAGNOSIS — Z794 Long term (current) use of insulin: Secondary | ICD-10-CM

## 2019-08-23 DIAGNOSIS — I2510 Atherosclerotic heart disease of native coronary artery without angina pectoris: Secondary | ICD-10-CM

## 2019-08-23 DIAGNOSIS — J9621 Acute and chronic respiratory failure with hypoxia: Secondary | ICD-10-CM

## 2019-08-23 DIAGNOSIS — E039 Hypothyroidism, unspecified: Secondary | ICD-10-CM

## 2019-08-23 DIAGNOSIS — E871 Hypo-osmolality and hyponatremia: Secondary | ICD-10-CM | POA: Diagnosis present

## 2019-08-23 DIAGNOSIS — Z515 Encounter for palliative care: Secondary | ICD-10-CM

## 2019-08-23 LAB — CBC
HCT: 25.8 % — ABNORMAL LOW (ref 36.0–46.0)
Hemoglobin: 8.5 g/dL — ABNORMAL LOW (ref 12.0–15.0)
MCH: 30 pg (ref 26.0–34.0)
MCHC: 32.9 g/dL (ref 30.0–36.0)
MCV: 91.2 fL (ref 80.0–100.0)
Platelets: 150 10*3/uL (ref 150–400)
RBC: 2.83 MIL/uL — ABNORMAL LOW (ref 3.87–5.11)
RDW: 20 % — ABNORMAL HIGH (ref 11.5–15.5)
WBC: 7.3 10*3/uL (ref 4.0–10.5)
nRBC: 0 % (ref 0.0–0.2)

## 2019-08-23 LAB — BLOOD GAS, ARTERIAL
Acid-base deficit: 1.8 mmol/L (ref 0.0–2.0)
Bicarbonate: 22.6 mmol/L (ref 20.0–28.0)
Drawn by: 59133
FIO2: 28
O2 Saturation: 98.6 %
Patient temperature: 37
pCO2 arterial: 39.8 mmHg (ref 32.0–48.0)
pH, Arterial: 7.373 (ref 7.350–7.450)
pO2, Arterial: 126 mmHg — ABNORMAL HIGH (ref 83.0–108.0)

## 2019-08-23 LAB — TSH: TSH: 10.292 u[IU]/mL — ABNORMAL HIGH (ref 0.350–4.500)

## 2019-08-23 LAB — BASIC METABOLIC PANEL
Anion gap: 13 (ref 5–15)
BUN: 88 mg/dL — ABNORMAL HIGH (ref 8–23)
CO2: 23 mmol/L (ref 22–32)
Calcium: 8.7 mg/dL — ABNORMAL LOW (ref 8.9–10.3)
Chloride: 90 mmol/L — ABNORMAL LOW (ref 98–111)
Creatinine, Ser: 3.12 mg/dL — ABNORMAL HIGH (ref 0.44–1.00)
GFR calc Af Amer: 16 mL/min — ABNORMAL LOW (ref >60)
GFR calc non Af Amer: 14 mL/min — ABNORMAL LOW (ref >60)
Glucose, Bld: 191 mg/dL — ABNORMAL HIGH (ref 70–99)
Potassium: 4.5 mmol/L (ref 3.5–5.1)
Sodium: 126 mmol/L — ABNORMAL LOW (ref 135–145)

## 2019-08-23 LAB — GLUCOSE, CAPILLARY
Glucose-Capillary: 179 mg/dL — ABNORMAL HIGH (ref 70–99)
Glucose-Capillary: 217 mg/dL — ABNORMAL HIGH (ref 70–99)
Glucose-Capillary: 242 mg/dL — ABNORMAL HIGH (ref 70–99)
Glucose-Capillary: 217 mg/dL — ABNORMAL HIGH (ref 70–99)

## 2019-08-23 LAB — HEMOGLOBIN A1C
Hgb A1c MFr Bld: 6.6 % — ABNORMAL HIGH (ref 4.8–5.6)
Mean Plasma Glucose: 142.72 mg/dL

## 2019-08-23 MED ORDER — ZINC OXIDE 40 % EX OINT
TOPICAL_OINTMENT | Freq: Four times a day (QID) | CUTANEOUS | Status: DC
  Filled 2019-08-23: qty 57

## 2019-08-23 MED ORDER — LEVOTHYROXINE SODIUM 50 MCG PO TABS
50.0000 ug | ORAL_TABLET | Freq: Every day | ORAL | Status: DC
  Administered 2019-08-24 – 2019-08-27 (×4): 50 ug via ORAL
  Filled 2019-08-23 (×4): qty 1

## 2019-08-23 MED ORDER — FUROSEMIDE 10 MG/ML IJ SOLN
80.0000 mg | Freq: Two times a day (BID) | INTRAMUSCULAR | Status: DC
  Administered 2019-08-23 – 2019-08-25 (×5): 80 mg via INTRAVENOUS
  Filled 2019-08-23 (×6): qty 8

## 2019-08-23 MED ORDER — ALBUTEROL SULFATE (2.5 MG/3ML) 0.083% IN NEBU
2.5000 mg | INHALATION_SOLUTION | Freq: Four times a day (QID) | RESPIRATORY_TRACT | Status: DC | PRN
  Administered 2019-08-23 – 2019-08-24 (×2): 2.5 mg via RESPIRATORY_TRACT
  Filled 2019-08-23 (×2): qty 3

## 2019-08-23 MED ORDER — METOLAZONE 5 MG PO TABS
5.0000 mg | ORAL_TABLET | Freq: Once | ORAL | Status: AC
  Administered 2019-08-23: 5 mg via ORAL
  Filled 2019-08-23: qty 1

## 2019-08-23 MED ORDER — FENTANYL CITRATE (PF) 100 MCG/2ML IJ SOLN
25.0000 ug | INTRAMUSCULAR | Status: AC | PRN
  Administered 2019-08-23 (×2): 50 ug via INTRAVENOUS
  Filled 2019-08-23 (×2): qty 2

## 2019-08-23 MED ORDER — FUROSEMIDE 10 MG/ML IJ SOLN
40.0000 mg | Freq: Once | INTRAMUSCULAR | Status: AC
  Administered 2019-08-23: 40 mg via INTRAVENOUS
  Filled 2019-08-23: qty 4

## 2019-08-23 NOTE — Progress Notes (Signed)
Case discussed with patient's daughter, Alicia Collins.  I reviewed the patient's chronic medical issues, current hospital course and expected prognosis.  [name] understands that patients long-term prognosis is likely poor.  She wishes to continue current treatments to see if there is any response.  While discussing goals of care, [name] relates that in discussions with patient in the past, she had indicated that she would not want to be on dialysis.  She also would not want to be on a ventilator/CPR and had a DNR prior to admission.  Will update CODE STATUS.  [name] wishes to be patient's primary contact since patient's husband does not hear very well.  Time spent: 83mins  [employer]

## 2019-08-23 NOTE — Evaluation (Signed)
Physical Therapy Evaluation Patient Details Name: Alicia Collins MRN: [id number] DOB: November 19, 1940 Today's Date: 08/23/2019   History of Present Illness  Pt is a 79 y.o. female with medical history significant of chronic systolic CHF (LVEF 35 to 67%), CAD, chronic iron deficiency anemia, CKD stage III, IDDM, hypertension, mild dementia, presented with worsening of shortness of breath and bilateral leg swelling for more than a month.  Pt admitted with resp failure with hypoxia, CHF, AKI, and hyponatremia.  Clinical Impression  Pt admitted with above diagnosis. She had severe pain in supine that was relieved once up and OOB.  Pt required multimodal cues and assist to initiate transfers.  Required min-mod A for sit to stand and taking a few steps for transfers. Pt was poor historian in regards to home environment and PLOF.  She does appear to have some support at home and necessary DME, but with current level of mobility recommend SNF.  Pt currently with functional limitations due to the deficits listed below (see PT Problem List). Pt will benefit from skilled PT to increase their independence and safety with mobility to allow discharge to the venue listed below.       Follow Up Recommendations SNF    Equipment Recommendations  None recommended by PT (has DME)    Recommendations for Other Services       Precautions / Restrictions Precautions Precautions: Fall      Mobility  Bed Mobility Overal bed mobility: Needs Assistance Bed Mobility: Supine to Sit     Supine to sit: Mod assist;HOB elevated     General bed mobility comments: assist for legs and to elevate trunk; verbal and tactile cues  Transfers Overall transfer level: Needs assistance Equipment used: Rolling walker (2 wheeled) Transfers: Sit to/from Stand Sit to Stand: Min assist;Mod assist         General transfer comment: Pt performed sit to stand x 4 with mod A from bed and min A from bsc and recliner; cues for  safe hand placement  Ambulation/Gait Ambulation/Gait assistance: Min assist Gait Distance (Feet): 2 Feet (2'x4) Assistive device: Rolling walker (2 wheeled) Gait Pattern/deviations: Step-to pattern;Decreased stride length;Trunk flexed Gait velocity: decreased   General Gait Details: cued for posture and RW use; assist at buttock/hips to maintain standing  Stairs            Wheelchair Mobility    Modified Rankin (Stroke Patients Only)       Balance Overall balance assessment: Needs assistance Sitting-balance support: Bilateral upper extremity supported Sitting balance-Leahy Scale: Poor Sitting balance - Comments: required UE support at EOB   Standing balance support: Bilateral upper extremity supported Standing balance-Leahy Scale: Poor Standing balance comment: required RW and min A                             Pertinent Vitals/Pain Pain Assessment: Faces Pain Score: 10-Worst pain ever (in bed) Faces Pain Scale: No hurt (once in chair) Pain Location: bil hips; all over Pain Descriptors / Indicators: Discomfort;Sore Pain Intervention(s): Limited activity within patient's tolerance;Repositioned (Pt yelling "please help me, I hurt" when in bed; once in chair pt resting quietly)    Home Living Family/patient expects to be discharged to:: Skilled nursing facility Living Arrangements: Spouse/significant other;Children (spouse and step daughter) Available Help at Discharge: Family;Available 24 hours/day Type of Home: House Home Access: Stairs to enter Entrance Stairs-Rails: Psychiatric nurse of Steps: 5 Home Layout: Two level;Able to  live on main level with bedroom/bathroom Home Equipment: Gilford Rile - 2 wheels;Bedside commode;Shower seat      Prior Function Level of Independence: Needs assistance   Gait / Transfers Assistance Needed: reports could ambulate short community distances with RW  ADL's / Homemaking Assistance Needed: reports  independent with ADLs but only does sponge baths  Comments: Pt vague/poor historian - required repeated questions to get complete answers     Hand Dominance        Extremity/Trunk Assessment   Upper Extremity Assessment Upper Extremity Assessment: Difficult to assess due to impaired cognition;Generalized weakness    Lower Extremity Assessment Lower Extremity Assessment: Difficult to assess due to impaired cognition;Generalized weakness    Cervical / Trunk Assessment Cervical / Trunk Assessment: Kyphotic  Communication   Communication: No difficulties  Cognition Arousal/Alertness: Awake/alert Behavior During Therapy: Restless;Anxious Overall Cognitive Status: No family/caregiver present to determine baseline cognitive functioning Area of Impairment: Orientation;Problem solving;Following commands                 Orientation Level: Disoriented to;Situation;Time     Following Commands: Follows one step commands with increased time     Problem Solving: Slow processing;Difficulty sequencing;Requires verbal cues        General Comments General comments (skin integrity, edema, etc.): Pt was 98% on 2 LPM O2; tried RA and sats maintained 96%; placed back on 2 LPM as pt lethargic.  Pt had c/o exacerbated chronic pain in bil hips. She was crying out in bed, but once up and in chair able to rest quietly.    Exercises     Assessment/Plan    PT Assessment Patient needs continued PT services  PT Problem List Decreased strength;Decreased mobility;Decreased safety awareness;Decreased range of motion;Decreased knowledge of precautions;Decreased activity tolerance;Decreased balance;Decreased knowledge of use of DME;Cardiopulmonary status limiting activity;Decreased cognition;Pain;Decreased coordination       PT Treatment Interventions DME instruction;Therapeutic activities;Modalities;Gait training;Therapeutic exercise;Patient/family education;Stair training;Balance  training;Functional mobility training    PT Goals (Current goals can be found in the Care Plan section)  Acute Rehab PT Goals Patient Stated Goal: decrease pain PT Goal Formulation: With patient Time For Goal Achievement: 09/06/19 Potential to Achieve Goals: Good    Frequency Min 2X/week   Barriers to discharge Inaccessible home environment      Co-evaluation               AM-PAC PT "6 Clicks" Mobility  Outcome Measure Help needed turning from your back to your side while in a flat bed without using bedrails?: A Lot Help needed moving from lying on your back to sitting on the side of a flat bed without using bedrails?: A Lot Help needed moving to and from a bed to a chair (including a wheelchair)?: A Lot Help needed standing up from a chair using your arms (e.g., wheelchair or bedside chair)?: A Lot Help needed to walk in hospital room?: A Lot Help needed climbing 3-5 steps with a railing? : A Lot 6 Click Score: 12    End of Session Equipment Utilized During Treatment: Gait belt;Oxygen Activity Tolerance: Patient tolerated treatment well Patient left: in chair;with chair alarm set;with nursing/sitter in room;with call bell/phone within reach Nurse Communication: Mobility status PT Visit Diagnosis: Muscle weakness (generalized) (M62.81);Unsteadiness on feet (R26.81)    Time: 2831-5176 PT Time Calculation (min) (ACUTE ONLY): 37 min   Charges:   PT Evaluation $PT Eval Moderate Complexity: 1 Mod PT Treatments $Therapeutic Activity: 8-22 mins  Abran Richard, PT Acute Rehab Services Pager [phone] [facility] [place] 08/23/2019, 11:24 AM

## 2019-08-23 NOTE — Progress Notes (Addendum)
PROGRESS NOTE    Alicia Collins  UUV:[id number] DOB: [birth year] DOA: 08/22/2019 PCP: Nicholos Johns, MD    Brief Narrative:  79 y/o female with history of systolic dysfunction, HTN, DM, CKD3 admitted to [facility] from [place] with acute on chronic systolic CHF, acute resp failure and aki on ckd stage 3. She is currently on diuretics and cardiology has been consulted.    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 Acute resp failure with hypoxia. Patient continues to have worsening shortness of breath. Resp failure secondary to decompensated CHF. Currently on 2L oxygen. Will check ABG and EKG. Wean off oxygen as tolerated. Use prn nebs 2. Acute on chronic systolic CHF. Previous echo showed EF of 35-40% (unclear when that was performed). Repeat echo ordered. She is admitted with volume overload and was started on lasix 40mg  BID. Since she continues to feel short of breath and only had 900cc urine out overnight, will increase lasix to 80mg  bid and give one dose of metolazone today. Will consult cardiology since she may need to be seen by advanced heart failure team and be considered for inotropes. She is chronically on lisinopril that is on hold due to borderline BP and elevated creatinine. Also holding coreg in setting of bradycardia and acute decompensation. 3. Hyponatremia. Hypervolemic. Mild improvement with lasix. Will continue to monitor with diuresis 4. AKI on CKD 3. Baseline creatinine one month ago was 1.5. Currently creatinine has trended up to 3. Likely related to low cardiac output. She was  also on lisinopril prior to admission. Continue to follow with diuresis. If does not improve, may need nephrology input. Renal ultrasound unremarkable 5. Elevated TSH. She is not on chronic thyroid replacement therapy. Check T3 and Free T4. Start on low dose synthroid.  6. HTN. On chronic hydralazine, coreg and lisinoprol. All meds on hold due to soft blood pressures and to allow for adequate diuresis 7. IDDM, with chronic kidney disease. She is lantus and SSI. Blood sugars stable. Holding metformin and farxiga. A1c 6.6  8. Anemia, likely related to kidney disease. No signs of bleeding. No prior hemoglobin for comparison. Continue to trend.   DVT prophylaxis: heparin injection 5,000 Units Start: 08/22/19 2200  Code Status: full code Family Communication: discussed with patient's husband over the phone Disposition Plan: Status is: Inpatient  Remains inpatient appropriate because:IV treatments appropriate due to intensity of illness or inability to take PO   Dispo: The patient is from: Home              Anticipated d/c is to: TBD              Anticipated d/c date is: 3 days              Patient currently is not medically stable to d/c.   Consultants:     Procedures:     Antimicrobials:       Subjective: Patient is sitting up in bed. Repeatedly calling out for help and says that her breathing is worse today  Objective: Vitals:   08/22/19 2132 08/22/19 2358  08/23/19 0415 08/23/19 0822  BP: (!) 110/54 (!) 102/57 103/64 (!) 98/55  Pulse: 66 67 60 62  Resp: 18 20 20    Temp:  98.6 F (37 C)    TempSrc:  Oral    SpO2: 100% 98% 100% 98%  Weight:   79.3 kg     Intake/Output Summary (Last 24 hours) at 08/23/2019 1052 Last data filed at 08/23/2019 0900 Gross per 24 hour  Intake 360 ml  Output 900 ml  Net -540 ml   Filed Weights   08/22/19 1827 08/23/19 0415  Weight: 79.1 kg 79.3 kg    Examination:  General exam: Appears uncomfortable, in mild distress  Respiratory  system: bilateral crackles with diminished breath sounds at bases. Respiratory effort normal. Cardiovascular system: S1 & S2 heard, bradycardic. No JVD, murmurs, rubs, gallops or clicks.  Gastrointestinal system: Abdomen is nondistended, soft and nontender. No organomegaly or masses felt. Normal bowel sounds heard. Central nervous system: Alert and oriented. No focal neurological deficits. Extremities: 2+ edema bilaterally Skin: No rashes, lesions or ulcers Psychiatry: anxious    Data Reviewed: I have personally reviewed following labs and imaging studies  CBC: Recent Labs  Lab 08/23/19 0823  WBC 7.3  HGB 8.5*  HCT 25.8*  MCV 91.2  PLT 269   Basic Metabolic Panel: Recent Labs  Lab 08/22/19 2037 08/23/19 0823  NA 125* 126*  K 5.2* 4.5  CL 90* 90*  CO2 22 23  GLUCOSE 122* 191*  BUN 86* 88*  CREATININE 3.05* 3.12*  CALCIUM 8.8* 8.7*  MG 3.0*  --    GFR: CrCl cannot be calculated (Unknown ideal weight.). Liver Function Tests: No results for input(s): AST, ALT, ALKPHOS, BILITOT, PROT, ALBUMIN in the last 168 hours. No results for input(s): LIPASE, AMYLASE in the last 168 hours. No results for input(s): AMMONIA in the last 168 hours. Coagulation Profile: No results for input(s): INR, PROTIME in the last 168 hours. Cardiac Enzymes: No results for input(s): CKTOTAL, CKMB, CKMBINDEX, TROPONINI in the last 168 hours. BNP (last 3 results) No results for input(s): PROBNP in the last 8760 hours. HbA1C: Recent Labs    08/23/19 0823  HGBA1C 6.6*   CBG: Recent Labs  Lab 08/22/19 2134 08/23/19 0759  GLUCAP 130* 179*   Lipid Profile: No results for input(s): CHOL, HDL, LDLCALC, TRIG, CHOLHDL, LDLDIRECT in the last 72 hours. Thyroid Function Tests: Recent Labs    08/23/19 0823  TSH 10.292*   Anemia Panel: No results for input(s): VITAMINB12, FOLATE, FERRITIN, TIBC, IRON, RETICCTPCT in the last 72 hours. Sepsis Labs: No results for input(s): PROCALCITON,  LATICACIDVEN in the last 168 hours.  No results found for this or any previous visit (from the past 240 hour(s)).       Radiology Studies: US RENAL  Result Date: 08/22/2019 CLINICAL DATA:  Acute renal insufficiency. EXAM: RENAL / URINARY TRACT ULTRASOUND COMPLETE COMPARISON:  None. FINDINGS: Right Kidney: Renal measurements: 10.8 cm x 4.1 cm x 4.3 cm = volume: 99.9 mL. There is diffusely increased echogenicity of the renal parenchyma. No mass or hydronephrosis visualized. Left Kidney: Renal measurements: 8.4 cm x 3.8 cm x 4.1 cm = volume: 68.6 mL. Echogenicity within normal limits. No mass or hydronephrosis visualized. Bladder: A Foley catheter is within the urinary bladder. Other: There is a mild amount of abdominal free fluid. Bilateral pleural effusions are seen. IMPRESSION: 1. Increased echogenicity of the right kidney which may be secondary to medical renal disease. 2. Mild amount of abdominal free  fluid. 3. Bilateral pleural effusions. Electronically Signed   By: Virgina Norfolk M.D.   On: 08/22/2019 22:11   DG Chest Port 1 View  Result Date: 08/23/2019 CLINICAL DATA:  CHF, shortness of breath, swelling in legs and feet, hypertension, diabetes mellitus EXAM: PORTABLE CHEST 1 VIEW COMPARISON:  Portable exam 0601 hours compared to 08/21/2019 FINDINGS: Enlargement of cardiac silhouette post CABG. Mediastinal contours normal with atherosclerotic calcification aorta. Slight pulmonary vascular congestion. Diffuse BILATERAL pulmonary infiltrates likely representing pulmonary edema. Bibasilar effusions and atelectasis. Progressive opacity in the retrocardiac LEFT lower lobe question which may represent atelectasis or consolidation. No pneumothorax. IMPRESSION: Enlargement of cardiac silhouette with vascular congestion and probable pulmonary edema. Bibasilar pleural effusions and atelectasis. New LEFT lower lobe opacification question atelectasis versus pneumonia. Electronically Signed   By: Lavonia Dana  M.D.   On: 08/23/2019 08:29        Scheduled Meds: . atorvastatin  40 mg Oral Daily  . ferrous sulfate  325 mg Oral Daily  . furosemide  80 mg Intravenous BID  . heparin  5,000 Units Subcutaneous Q12H  . insulin aspart  0-6 Units Subcutaneous TID WC  . insulin glargine  10 Units Subcutaneous QHS  . isosorbide mononitrate  15 mg Oral Daily  . liver oil-zinc oxide   Topical QID  . magnesium oxide  400 mg Oral BID  . omega-3 acid ethyl esters  1 g Oral Daily  . sodium chloride flush  3 mL Intravenous Q12H   Continuous Infusions: . sodium chloride       LOS: 1 day    Time spent: 71mins    Kathie Dike, MD [facility]   If 7PM-7AM, please contact night-coverage [email or web address]  08/23/2019, 10:52 AM

## 2019-08-23 NOTE — Consult Note (Addendum)
Cardiology Consultation:   Patient ID: Alicia Collins MRN: [id number]; DOB: [birth year]  Admit date: 08/22/2019 Date of Consult: 08/23/2019  Primary Care Provider: Nicholos Johns, MD [facility] Cardiologist: Jenean Lindau, MD  [facility] Electrophysiologist:  None    Patient Profile:   Alicia Collins is a 79 y.o. female with a history of CAD s/p CABG, chronic systolic CHF/ischemic cardiomyopathy with EF of 35-40%, hypertension, dyslipidemia, diabetes mellitus, chronic iron deficiency anemia, CKD stage III, and mild dementia who is being seen today for the evaluation of CHF at the request of Alicia Collins.  History of Present Illness:   Alicia Collins is a 79 year old female with the above history. Patient admitted to [facility] earlier this 04/2019 for fatigued. She was found to have significant anemia requiring blood transfusions. No active source was found. Echo during this admission reportedly showed LVEF of 35-40%. Sounds like troponin was elevated but it does not look like any ischemic work-up was complete. Suspect this was felt to be due to demand ischemia in setting of acute anemia. Unable to to personally see Alicia Collins notes but reviewed brief summary in Alicia Collins last note. Patient was seen by Alicia Collins for follow-up on 05/25/2019 (1st time she had been seen by Cardiology since 2018) at which time she was doing better. Given patient's age and frail state, Alicia Collins did not recommend any additional ischemic evaluation for her reduced EF and advised medical management which patient agreed with. Given borderline BP, Coreg was decreased to 6.8m twice daily. She was advised to follow-up in 1 month but it does not look like this happened.   Since last visit, she was diagnosed with acute cholecystitis. She was reportedly not felt to be a surgical candidate by Cardiology and therefore underwent placement of percutaneous cholecystomy tube.   [facility] evaluation of right upper extremity swelling, chest pain, and shortness of breath over the last month. She was noted to have hyponatremia and AKI on arrival. Sodium was 123 (134 one month ago) and creatinine was 2.60 (1.5 one month ago). Also slightly hyperkalemic with potassium of 5.3. Pro BNP elevated at 34,300. Troponin I negative x1. Chest x-ray showed small to moderate right pleural effusion and moderate to marked right basilar atelectasis and/or infiltrate. Right humeral x-ray was unremarkable. Procalcitonin negative. She was given dose of IV Lasix 655mand then decision was made [facility] further work-up. Upon arrival here, creatinine 3.05 >> 3.12. Potassium 5.2 >> 4.5. Magnesium 3.0. Hemoglobin 8.5. TSH elevated at 10.292. Echo pending.   At the time of this evaluation, patient resting in no acute distress. She has dementia but is not fully oriented. She could tell me her name but when asked her birthday and year she said "I will have to think about it." [facility] and patient is a poor historian. She could not remember if she still had her cholecystomy tube in place. When asked what brought her in, she initially said she could not remember and then she said "I think it was a heart attack." However, she denied any recent chest pain to me. She does report some shortness of breath at for a while but could not elaborate on this. Denied and orthopnea. She also reported on and off lower extremity swelling for a while. She denied any palpitations,  lightheadedness, dizziness, or syncope. No clear obvious bleeding in urine or stools. Was not able to obtain any additional history or ROS.    Past Medical History:  Diagnosis Date  . Coronary artery disease involving coronary bypass graft of native heart without angina pectoris 08/14/2014  . Diabetes ([place])    . Dyslipidemia 08/14/2014  . Essential hypertension 08/14/2014  . GERD (gastroesophageal reflux disease)   . Hypertension     Past Surgical History:  Procedure Laterality Date  . ABDOMINAL HYSTERECTOMY    . ANGIOPLASTY    . CARDIAC CATHETERIZATION    . CORONARY ARTERY BYPASS GRAFT       Home Medications:  Prior to Admission medications   Medication Sig Start Date End Date Taking? Authorizing Provider  acetaminophen (TYLENOL) 325 MG tablet Take 650 mg by mouth every 6 (six) hours as needed for moderate pain.    Yes [provider]  aspirin EC 81 MG tablet Take 81 mg by mouth daily. Swallow whole.   Yes [provider]  CALCIUM PO Take 1 tablet by mouth daily.   Yes [provider]  carvedilol (COREG) 12.5 MG tablet Take 12.5 mg by mouth 2 (two) times daily with a meal.   Yes [provider]  dapagliflozin propanediol (FARXIGA) 10 MG TABS tablet Take 10 mg by mouth daily.   Yes [provider]  ferrous sulfate 325 (65 FE) MG tablet Take 325 mg by mouth daily. 05/15/19  Yes [provider]  furosemide (LASIX) 20 MG tablet Take 40 mg by mouth 2 (two) times daily.    Yes [provider]  Glucosamine-Chondroitin (GLUCOSAMINE CHONDR COMPLEX PO) Take by mouth.   Yes [provider]  lisinopril (ZESTRIL) 40 MG tablet Take 40 mg by mouth at bedtime.    Yes [provider]  magnesium oxide (MAG-OX) 400 (241.3 Mg) MG tablet Take 1 tablet by mouth 2 (two) times daily. 02/20/19  Yes [provider]  nitroGLYCERIN (NITROSTAT) 0.4 MG SL tablet Place under the tongue. 06/26/16  Yes [provider]  Omega-3 Fatty Acids (OMEGA-3 FISH OIL PO) Take by mouth.   Yes [provider]  OVER THE COUNTER MEDICATION Take 1 tablet by mouth daily. Medication: Advanced eye complex   Yes [provider]  pantoprazole (PROTONIX) 40 MG tablet Take 40 mg by mouth daily.   Yes [provider]    simvastatin (ZOCOR) 80 MG tablet Take 80 mg by mouth at bedtime.    Yes [provider]  TOUJEO SOLOSTAR 300 UNIT/ML Solostar Pen Inject 16-20 Units into the skin at bedtime. Per sliding scale 05/09/19  Yes [provider]  traMADol (ULTRAM) 50 MG tablet Take 50-100 mg by mouth every 6 (six) hours as needed for moderate pain. Take 50 mg tablet by mouth when pain starts, if continues give another 50 mg tablet per daughter   Yes [provider]  Vitamin D, Ergocalciferol, (DRISDOL) 1.25 MG (50000 UNIT) CAPS capsule Take 50,000 Units by mouth every 7 (seven) days.   Yes [provider]  carvedilol (COREG) 6.25 MG tablet Take 1 tablet (6.25 mg total) by mouth daily. Patient not taking: Reported on 08/22/2019 05/25/19   Tobb, Kardie, DO  metFORMIN (GLUCOPHAGE) 500 MG tablet Take by mouth. Patient not taking: Reported on 08/22/2019    [provider]    Inpatient Medications: Scheduled Meds: . atorvastatin  40 mg Oral Daily  . ferrous sulfate  325 mg Oral Daily  . furosemide  80 mg Intravenous BID  . heparin  5,000 Units Subcutaneous Q12H  . insulin aspart  0-6 Units Subcutaneous TID WC  . insulin glargine  10 Units Subcutaneous QHS  . isosorbide mononitrate  15 mg Oral Daily  . [START ON 08/24/2019] levothyroxine  50 mcg Oral Q0600  . liver oil-zinc oxide   Topical QID  . magnesium oxide  400 mg Oral BID  . omega-3 acid ethyl esters  1 g Oral Daily  . sodium chloride flush  3 mL Intravenous Q12H   Continuous Infusions: . sodium chloride     PRN Meds: sodium chloride, acetaminophen, albuterol, ondansetron (ZOFRAN) IV, sodium chloride flush  Allergies:    Allergies  Allergen Reactions  . Rosuvastatin     unknown  . Penicillins Rash    Social History:   Social History   Socioeconomic History  . Marital status: Married    Spouse name: Not on file  . Number of children: Not on file  . Years of education: Not on file  . Highest education  level: Not on file  Occupational History  . Not on file  Tobacco Use  . Smoking status: Never Smoker  . Smokeless tobacco: Never Used  Substance and Sexual Activity  . Alcohol use: Not on file  . Drug use: Never  . Sexual activity: Not on file  Other Topics Concern  . Not on file  Social History Narrative  . Not on file   Social Determinants of Health   Financial Resource Strain:   . Difficulty of Paying Living Expenses:   Food Insecurity:   . Worried About Charity fundraiser in the Last Year:   . Arboriculturist in the Last Year:   Transportation Needs:   . Film/video editor (Medical):   Marland Kitchen Lack of Transportation (Non-Medical):   Physical Activity:   . Days of Exercise per Week:   . Minutes of Exercise per Session:   Stress:   . Feeling of Stress :   Social Connections:   . Frequency of Communication with Friends and Family:   . Frequency of Social Gatherings with Friends and Family:   . Attends Religious Services:   . Active Member of Clubs or Organizations:   . Attends Archivist Meetings:   Marland Kitchen Marital Status:   Intimate Partner Violence:   . Fear of Current or Ex-Partner:   . Emotionally Abused:   Marland Kitchen Physically Abused:   . Sexually Abused:     Family History:    Family History  Problem Relation Age of Onset  . Diabetes Father   . Stroke Father   . Heart disease Father      ROS:  Please see the history of present illness.  ROS limited by dementia.  Physical Exam/Data:   Vitals:   08/22/19 2358 08/23/19 0415 08/23/19 0822 08/23/19 1306  BP: (!) 102/57 103/64 (!) 98/55 (!) 98/41  Pulse: 67 60 62 (!) 57  Resp: 20 20  18   Temp: 98.6 F (37 C)   (!) 97.2 F (36.2 C)  TempSrc: Oral     SpO2: 98% 100% 98% 97%  Weight:  79.3 kg      Intake/Output Summary (Last 24 hours) at 08/23/2019 1336 Last data filed at 08/23/2019 0900 Gross per 24 hour  Intake 480 ml  Output 900 ml  Net -420 ml   Last 3 Weights 08/23/2019 08/22/2019 05/25/2019  Weight  (lbs) 174 lb 13.2 oz 174 lb 6.1 oz (  No Data)  Weight (kg) 79.3 kg 79.1 kg (No Data)     There is no height or weight on file to calculate BMI.  General: 79 y.o. female resting comfortably in no acute distress. HEENT: Normocephalic and atraumatic. Sclera clear.  Neck: Supple. No carotid bruits. JVD difficult to appreciate based on patient position. Heart: Borderline bradycardic. Distinct S1 and S2. Soft systolic murmur. Possible gallop. No rub. Radial and distal pedal pulses 2+ and equal bilaterally. Lungs: No increased work of breathing. Decreased breath sounds in bases with some mild crackles. No wheezes or rhonchi. Abdomen: Soft, non-distended, and non-tender to palpation. Bowel sounds present. Percutaneous cholecystomy tube present. MSK: Normal strength and tone for age. Extremities: 1+ pitting edema of bilateral lower extremities. Bilateral lower extremities wrapped in gauze.   Skin: Warm and dry. Neuro: Alert but disoriented. Could not tell me birthday, year, or what brought her in. No focal deficits. Psych: Normal affect. Responds appropriately.  EKG:  The EKG was personally reviewed and demonstrates:  Normal sinus rhythm, rate 63 bpm, with non-specific ST/T changes. Right axis deviation. 1st degree AV block. QTc 450 ms.   Telemetry:  Telemetry was personally reviewed and demonstrates:  Sinus rhythm with rates in the high 50's to 60's.   Relevant CV Studies: Echo pending.   Laboratory Data:  High Sensitivity Troponin:  No results for input(s): TROPONINIHS in the last 720 hours.   Chemistry Recent Labs  Lab 09/09/19 2037 08/23/19 0823  NA 125* 126*  K 5.2* 4.5  CL 90* 90*  CO2 22 23  GLUCOSE 122* 191*  BUN 86* 88*  CREATININE 3.05* 3.12*  CALCIUM 8.8* 8.7*  GFRNONAA 14* 14*  GFRAA 16* 16*  ANIONGAP 13 13    No results for input(s): PROT, ALBUMIN, AST, ALT, ALKPHOS, BILITOT in the last 168 hours. Hematology Recent Labs  Lab 08/23/19 0823  WBC 7.3  RBC 2.83*  HGB  8.5*  HCT 25.8*  MCV 91.2  MCH 30.0  MCHC 32.9  RDW 20.0*  PLT 150   BNPNo results for input(s): BNP, PROBNP in the last 168 hours.  DDimer No results for input(s): DDIMER in the last 168 hours.   Radiology/Studies:  US RENAL  Result Date: 09/09/2019 CLINICAL DATA:  Acute renal insufficiency. EXAM: RENAL / URINARY TRACT ULTRASOUND COMPLETE COMPARISON:  None. FINDINGS: Right Kidney: Renal measurements: 10.8 cm x 4.1 cm x 4.3 cm = volume: 99.9 mL. There is diffusely increased echogenicity of the renal parenchyma. No mass or hydronephrosis visualized. Left Kidney: Renal measurements: 8.4 cm x 3.8 cm x 4.1 cm = volume: 68.6 mL. Echogenicity within normal limits. No mass or hydronephrosis visualized. Bladder: A Foley catheter is within the urinary bladder. Other: There is a mild amount of abdominal free fluid. Bilateral pleural effusions are seen. IMPRESSION: 1. Increased echogenicity of the right kidney which may be secondary to medical renal disease. 2. Mild amount of abdominal free fluid. 3. Bilateral pleural effusions. Electronically Signed   By: Virgina Norfolk M.D.   On: 09/09/19 22:11   DG Chest Port 1 View  Result Date: 08/23/2019 CLINICAL DATA:  CHF, shortness of breath, swelling in legs and feet, hypertension, diabetes mellitus EXAM: PORTABLE CHEST 1 VIEW COMPARISON:  Portable exam 0601 hours compared to 08/21/2019 FINDINGS: Enlargement of cardiac silhouette post CABG. Mediastinal contours normal with atherosclerotic calcification aorta. Slight pulmonary vascular congestion. Diffuse BILATERAL pulmonary infiltrates likely representing pulmonary edema. Bibasilar effusions and atelectasis. Progressive opacity in the retrocardiac LEFT lower lobe question  which may represent atelectasis or consolidation. No pneumothorax. IMPRESSION: Enlargement of cardiac silhouette with vascular congestion and probable pulmonary edema. Bibasilar pleural effusions and atelectasis. New LEFT lower lobe  opacification question atelectasis versus pneumonia. Electronically Signed   By: Lavonia Dana M.D.   On: 08/23/2019 08:29   Assessment and Plan:   Acute on Chronic Combined CHF - Pro BNP at [facility] elevated at 34,000.  - Chest x-ray at [facility] showed right pleural effusion. Repeat chest x-ray here showed vascular congestion and probable pulmonary edema as well as bibasilar pleural effusion and atelectasis. Also noted new left lower lobe opacification that could be atelectasis vs pneumonia. - LVEF of 35-40% in 04/2019. Repeat Echo ordered but not done yet. - [facility]. IV Lasix increased to 868mtwice daily today and one dose of Metolazone 38m71maily was also given. Has not had significant urinary output with this. Weight unchanged from yesterday (actually slightly up). Creatinine elevated but stable.  - Will discuss diuresis with MD. - On Coreg 6.238m11mice daily at home but this was stopped to day due to soft BP, borderline bradycardia, and decompensated CHF with concern for low output. Agree with this.  - Home Lisinopril on hold due to renal function. - Continue to monitor daily weight, strict I/O's, and renal function. - Will discuss with MD about whether Advanced Heart Failure team needs to be consulted but given dementia and frail state, he may not be a candidate for advanced therapies.  CAD - S/p CABG. - EKG with non-specific ST/T changes.  - [facility]  - Not currently on Aspirin here (on Aspirin at home) - presumably due to recent GI bleed with hemoglobin of 8.5 today. If hemoglobin stable, would resume. Continue high-intensity statin.  Hypertension - Initially hypertensive but now borderline hypotensive. Most recent BP 98/41. - Coreg and Hydralazine stopped. Will also hold Imdur for now.   Dyslipidemia - Continue Lipitor 40mg17mly.  Diabetes Mellitus  -  Hemoglobin A1c 6.6 this admission.  - On Farxiga at home but currently on hold given renal function. - Management per primary team.  Acute on CKD Stage III - Creatinine 3.12 today. Baseline reportedly around 1.5. - Renal ultrasound showed increased echogenicity of the right kidney which may be secondary to medical renal disease as well as mild amount of abdominal free fluid and bilateral pleural effusions.  - Hold all Nephrotoxic agents.  - Possibly due to low output.  - Continue to monitor closely. May need to consult Nephrology.   Cholecystitis  - S/p recent placement of percutaneous cholecystomy tube. - Labs at [facility], ALT 33, Alk Phos 469, Total Bili 1.4.  - Will recheck LFTs tomorrow. - Management per primary team.   Hyponatremia - Suspect due to to hypervolemia. - Volume management as above. - Otherwise, per primary team.  Hyperkalemia/Hypermagnesemia - Potassium inially 5.3 at [facility] and 4.5 today. - Magnesium 3.0 today. Will stop Magnesium supplement. - Continue to monitor closely.  Chronic Anemia - Hemoglobin 8.5 today.  - Continue to monitor closely.  Elevated TSH - TSH elevated at 19.292. - Free T4 and free T3 pending.  - Primary team started low dose Synthroid.  For questions or updates, please contact [facility] consult [email or web address] for contact info under    Signed, CalliDarreld McleanC  08/23/2019 1:36 PM  Personally seen and  examined. Agree with above.   Laying on her side in bed, fairly nonconversant.  Resting.  Percutaneous drain noted.  Lungs decreased breath sounds right base.  Confused.  Lab work reviewed.  Acute on chronic systolic heart failure -BNP was 34000.  Right pleural effusion.  Pulmonary vascular congestion.  IV Lasix 80 mg twice a day with metolazone 5 mg given once.  Continuing to monitor urine output. -Coreg 6.25 was stopped because of borderline bradycardia and low blood pressure.  Agree.   Holding lisinopril as well because of renal function. -I do not think that advanced heart failure team would have much more to offer. -She is not a candidate for advanced therapies such as inotropes, defibrillator, mechanical support. -Renal function continuing to deteriorate.  CAD -Post CABG.  Not really complaining of any angina. -Aspirin is ideal however currently on hold because of recent GI bleed hemoglobin 8.5  Diabetes with hypertension and hyperlipidemia -Continue with goal-directed therapy.  Cholecystitis -Recent PERC tube.  Elevated alk phos  Hyponatremia -Watch closely especially with metolazone.  Ultimately, goals of care discussions have begun and will continue to be ongoing.  Appreciate Alicia Collins having discussion with daughter.  DNR status.  Palliative care would be helpful.  Candee Furbish, MD

## 2019-08-23 NOTE — Consult Note (Signed)
[place] Nurse Consult Note: Patient receiving care in [place]. Reason for Consult: "leg wound" Wound type: healed areas to bilateral gaiter sites, consistent with resorbed bullae.  No open wounds observed. There are many, many, scattered bruises and purple/maroon areas across the entire body.  Along the posterior left calf there are maroon areas measuring 10.2 cm x 2.6, none of which are open.  There is also a patch of maroon discoloration on the left posterior heel that measures 6 cm x 3 cm, none of which are open.  These could be DTPIs, or perhaps some other origin.  The patient c/o pain to the buttocks.  The buttocks was without a wound or pressure injury, however, she is incontinent of stool. Pressure Injury POA: Yes/No/NA Measurement: Wound bed: Drainage (amount, consistency, odor)  Periwound: Dressing procedure/placement/frequency: Every other day application of Xeroform gauzes to the bilateral gaiter areas, secure with kerlex, to start today. Desitin 40% QID to buttocks and labia. Bilateral Prevalon heel lift boots. A regular size bed with low air loss mattress. And, I enacted the 3 step skin program from the Standing Order Set. Thank you for the consult. [place] nurse will not follow at this time.  Please re-consult the [place] team if needed.  Val Riles, RN, MSN, CWOCN, CNS-BC, pager [phone]

## 2019-08-24 ENCOUNTER — Inpatient Hospital Stay (HOSPITAL_COMMUNITY): Payer: Medicare Other

## 2019-08-24 DIAGNOSIS — I5021 Acute systolic (congestive) heart failure: Secondary | ICD-10-CM

## 2019-08-24 LAB — CBC
HCT: 25.6 % — ABNORMAL LOW (ref 36.0–46.0)
Hemoglobin: 8.5 g/dL — ABNORMAL LOW (ref 12.0–15.0)
MCH: 29.9 pg (ref 26.0–34.0)
MCHC: 33.2 g/dL (ref 30.0–36.0)
MCV: 90.1 fL (ref 80.0–100.0)
Platelets: 142 10*3/uL — ABNORMAL LOW (ref 150–400)
RBC: 2.84 MIL/uL — ABNORMAL LOW (ref 3.87–5.11)
RDW: 19.8 % — ABNORMAL HIGH (ref 11.5–15.5)
WBC: 6.7 10*3/uL (ref 4.0–10.5)
nRBC: 0 % (ref 0.0–0.2)

## 2019-08-24 LAB — MAGNESIUM: Magnesium: 3.1 mg/dL — ABNORMAL HIGH (ref 1.7–2.4)

## 2019-08-24 LAB — COMPREHENSIVE METABOLIC PANEL
ALT: 24 U/L (ref 0–44)
AST: 29 U/L (ref 15–41)
Albumin: 2.6 g/dL — ABNORMAL LOW (ref 3.5–5.0)
Alkaline Phosphatase: 325 U/L — ABNORMAL HIGH (ref 38–126)
Anion gap: 14 (ref 5–15)
BUN: 87 mg/dL — ABNORMAL HIGH (ref 8–23)
CO2: 22 mmol/L (ref 22–32)
Calcium: 8.7 mg/dL — ABNORMAL LOW (ref 8.9–10.3)
Chloride: 90 mmol/L — ABNORMAL LOW (ref 98–111)
Creatinine, Ser: 3.3 mg/dL — ABNORMAL HIGH (ref 0.44–1.00)
GFR calc Af Amer: 15 mL/min — ABNORMAL LOW (ref >60)
GFR calc non Af Amer: 13 mL/min — ABNORMAL LOW (ref >60)
Glucose, Bld: 118 mg/dL — ABNORMAL HIGH (ref 70–99)
Potassium: 4.6 mmol/L (ref 3.5–5.1)
Sodium: 126 mmol/L — ABNORMAL LOW (ref 135–145)
Total Bilirubin: 1.2 mg/dL (ref 0.3–1.2)
Total Protein: 5.3 g/dL — ABNORMAL LOW (ref 6.5–8.1)

## 2019-08-24 LAB — GLUCOSE, CAPILLARY
Glucose-Capillary: 170 mg/dL — ABNORMAL HIGH (ref 70–99)
Glucose-Capillary: 108 mg/dL — ABNORMAL HIGH (ref 70–99)
Glucose-Capillary: 56 mg/dL — ABNORMAL LOW (ref 70–99)
Glucose-Capillary: 72 mg/dL (ref 70–99)
Glucose-Capillary: 81 mg/dL (ref 70–99)

## 2019-08-24 LAB — T4, FREE: Free T4: 0.72 ng/dL (ref 0.61–1.12)

## 2019-08-24 LAB — ECHOCARDIOGRAM COMPLETE: Weight: 2768 oz

## 2019-08-24 NOTE — Progress Notes (Signed)
Pt unable to urinate post-foley removal. Bladder scan at 2200 showed 157. Bladder scan at 0400 showed 352; In & Out successful this AM - 500cc amber urine. Continuing to monitor.

## 2019-08-24 NOTE — Progress Notes (Signed)
Patient is yelling she cant breath was found with 02 off during report reapplied 02 sat is 94%  On 4 Ls of 02 N/C

## 2019-08-24 NOTE — Significant Event (Signed)
Dr. Jerel Shepherd Office Called from [place] injuring about patient abdominal drainage tube that's scheduled to be Removed July 19th. Please call if pt is still in hospital or at another facility@ [phone].

## 2019-08-24 NOTE — Progress Notes (Signed)
Progress Note  Patient Name: Alicia Collins Date of Encounter: 08/24/2019  [facility] Cardiologist: Jenean Lindau, MD   Subjective   Sleeping on left side in bed.  Fairly nonconversant.  Inpatient Medications    Scheduled Meds: . atorvastatin  40 mg Oral Daily  . ferrous sulfate  325 mg Oral Daily  . furosemide  80 mg Intravenous BID  . heparin  5,000 Units Subcutaneous Q12H  . insulin aspart  0-6 Units Subcutaneous TID WC  . insulin glargine  10 Units Subcutaneous QHS  . levothyroxine  50 mcg Oral Q0600  . liver oil-zinc oxide   Topical QID  . omega-3 acid ethyl esters  1 g Oral Daily  . sodium chloride flush  3 mL Intravenous Q12H   Continuous Infusions: . sodium chloride     PRN Meds: sodium chloride, acetaminophen, albuterol, ondansetron (ZOFRAN) IV, sodium chloride flush   Vital Signs    Vitals:   08/23/19 1930 08/24/19 0418 08/24/19 0429 08/24/19 0734  BP: (!) 103/44  (!) 118/49 (!) 145/73  Pulse: 60  (!) 59 62  Resp: 19  20 20   Temp: 98.9 F (37.2 C)  98.9 F (37.2 C)   TempSrc: Oral  Oral   SpO2: 98%  92% 94%  Weight:  78.5 kg      Intake/Output Summary (Last 24 hours) at 08/24/2019 1024 Last data filed at 08/24/2019 0600 Gross per 24 hour  Intake 530 ml  Output 700 ml  Net -170 ml   Last 3 Weights 08/24/2019 08/23/2019 08/22/2019  Weight (lbs) 173 lb 174 lb 13.2 oz 174 lb 6.1 oz  Weight (kg) 78.472 kg 79.3 kg 79.1 kg      Telemetry    No adverse arrhythmias, heart rate 50s to 60s- Personally Reviewed  ECG    Sinus rhythm first-degree AV block nonspecific ST-T wave changes.- Personally Reviewed  Physical Exam   GEN:  Frail, elderly Neck: No JVD Cardiac: RRR, soft systolic murmur,no rubs, or gallops.  Respiratory:  Decreased breath sounds right base. GI: Soft, nontender, non-distended percutaneous drain noted MS: No edema; No deformity. Neuro:  Nonfocal  Psych: Normal affect   Labs    High Sensitivity Troponin:  No results  for input(s): TROPONINIHS in the last 720 hours.    Chemistry Recent Labs  Lab 08/22/19 2037 08/23/19 0823  NA 125* 126*  K 5.2* 4.5  CL 90* 90*  CO2 22 23  GLUCOSE 122* 191*  BUN 86* 88*  CREATININE 3.05* 3.12*  CALCIUM 8.8* 8.7*  GFRNONAA 14* 14*  GFRAA 16* 16*  ANIONGAP 13 13     Hematology Recent Labs  Lab 08/23/19 0823  WBC 7.3  RBC 2.83*  HGB 8.5*  HCT 25.8*  MCV 91.2  MCH 30.0  MCHC 32.9  RDW 20.0*  PLT 150    BNPNo results for input(s): BNP, PROBNP in the last 168 hours.   DDimer No results for input(s): DDIMER in the last 168 hours.   Radiology    US RENAL  Result Date: 08/22/2019 CLINICAL DATA:  Acute renal insufficiency. EXAM: RENAL / URINARY TRACT ULTRASOUND COMPLETE COMPARISON:  None. FINDINGS: Right Kidney: Renal measurements: 10.8 cm x 4.1 cm x 4.3 cm = volume: 99.9 mL. There is diffusely increased echogenicity of the renal parenchyma. No mass or hydronephrosis visualized. Left Kidney: Renal measurements: 8.4 cm x 3.8 cm x 4.1 cm = volume: 68.6 mL. Echogenicity within normal limits. No mass or hydronephrosis visualized. Bladder: A  Foley catheter is within the urinary bladder. Other: There is a mild amount of abdominal free fluid. Bilateral pleural effusions are seen. IMPRESSION: 1. Increased echogenicity of the right kidney which may be secondary to medical renal disease. 2. Mild amount of abdominal free fluid. 3. Bilateral pleural effusions. Electronically Signed   By: Virgina Norfolk M.D.   On: 08/22/2019 22:11   DG Chest Port 1 View  Result Date: 08/23/2019 CLINICAL DATA:  CHF, shortness of breath, swelling in legs and feet, hypertension, diabetes mellitus EXAM: PORTABLE CHEST 1 VIEW COMPARISON:  Portable exam 0601 hours compared to 08/21/2019 FINDINGS: Enlargement of cardiac silhouette post CABG. Mediastinal contours normal with atherosclerotic calcification aorta. Slight pulmonary vascular congestion. Diffuse BILATERAL pulmonary infiltrates likely  representing pulmonary edema. Bibasilar effusions and atelectasis. Progressive opacity in the retrocardiac LEFT lower lobe question which may represent atelectasis or consolidation. No pneumothorax. IMPRESSION: Enlargement of cardiac silhouette with vascular congestion and probable pulmonary edema. Bibasilar pleural effusions and atelectasis. New LEFT lower lobe opacification question atelectasis versus pneumonia. Electronically Signed   By: Lavonia Dana M.D.   On: 08/23/2019 08:29    Cardiac Studies   Prior echo EF 35 to 40%  Patient Profile     79 y.o. female with known CAD post CABG several years ago with ischemic cardiomyopathy EF 35 to 40%, cholecystitis with percutaneous drain, confusion with baseline mild dementia according past medical history.  Assessment & Plan    Acute on chronic systolic heart failure -Continue with IV Lasix 80 mg twice a day.  Meager output.  Creatinine has increased as well as BUN.  Clearly has a degree of AKI associated cardiorenal syndrome with her underlying illness. -Agree with palliative care assistance.  Agree with DNR.  Acute kidney injury -Creatinine continues to increase 3.12-baseline apparently 1.5.  If this continues, may have to hold Lasix.  Hyponatremia -Mild improvement.  We will try to avoid metolazone in the setting. -Continue with IV Lasix.  Fluid restriction.      For questions or updates, please contact [place] Please consult [email or web address] for contact info under        Signed, Candee Furbish, MD  08/24/2019, 10:24 AM

## 2019-08-24 NOTE — Progress Notes (Signed)
Pt was I&out cath 900cc out

## 2019-08-24 NOTE — Progress Notes (Signed)
Patient has decreased output.  blaader scan 117cc

## 2019-08-24 NOTE — Progress Notes (Signed)
PROGRESS NOTE    Alicia Collins   ZOX:[id number]  DOB: 10-13-1940  DOA: 08/22/2019 PCP: Nicholos Johns, MD   Brief Narrative:  Alicia Collins 79 y/o female with history of systolic dysfunction (LVEF 35 to 40%), CAD, chronic iron deficiency anemia, HTN, DM, CKD3, cholecystitis s/p cholecystostomy tube transferred to [facility] from [place] with acute on chronic systolic CHF, acute resp failure and AKI on CKD stage 3.     Subjective: No complaints.   Assessment & Plan:   Principal Problem:   Acute on chronic systolic CHF (congestive heart failure) with acute respiratory failure Hypervolemic hyponatremia - cont diuretics per cardiology team- wean O2 as able - repeat ECHO is pending today  Active Problems:   AKI- CKD 3 - Cr was 1.5 about a month ago - unfortunately, Cr is trending up > 3.0 - ? Cardiorenal  Elevated TSH- hypothyroid - Free T 4 is normal - cont current dose of Synthroid- 50 mcg and follow as outpatient  Type 2 diabetes mellitus with stage 3 chronic kidney disease (HCC)  - A1c 6.6- cont Lantus and SSI    Acute cholecystitis s/p drain - drain needs to be re-assess on 7/19 in [place]  Time spent in minutes: 35 DVT prophylaxis: Heparin Code Status: DNR Family Communication:  Disposition Plan:  Status is: Inpatient  Remains inpatient appropriate because:receiving IV diuresis   Dispo: The patient is from: Home              Anticipated d/c is to: SNF              Anticipated d/c date is: 2 days              Patient currently is medically stable to d/c.  Consultants:   Cardiology  Palliative care Procedures:     Antimicrobials:  Anti-infectives (From admission, onward)   None       Objective: Vitals:   08/24/19 0418 08/24/19 0429 08/24/19 0734 08/24/19 1223  BP:  (!) 118/49 (!) 145/73 116/66  Pulse:  (!) 59 62 (!) 54  Resp:  20 20 18   Temp:  98.9 F (37.2 C)    TempSrc:  Oral    SpO2:  92% 94% 100%  Weight: 78.5 kg        Intake/Output Summary (Last 24 hours) at 08/24/2019 1330 Last data filed at 08/24/2019 0600 Gross per 24 hour  Intake 290 ml  Output 700 ml  Net -410 ml   Filed Weights   08/22/19 1827 08/23/19 0415 08/24/19 0418  Weight: 79.1 kg 79.3 kg 78.5 kg    Examination: General exam: Appears comfortable  HEENT: PERRLA, oral mucosa moist, no sclera icterus or thrush Respiratory system: Clear to auscultation. Respiratory effort normal. Cardiovascular system: S1 & S2 heard, RRR.   Gastrointestinal system: Abdomen soft, non-tender, nondistended. Normal bowel sounds. Central nervous system: Alert and oriented. No focal neurological deficits. Extremities: No cyanosis, clubbing or edema Skin: No rashes or ulcers Psychiatry:  Mood & affect appropriate.     Data Reviewed: I have personally reviewed following labs and imaging studies  CBC: Recent Labs  Lab 08/23/19 0823 08/24/19 1130  WBC 7.3 6.7  HGB 8.5* 8.5*  HCT 25.8* 25.6*  MCV 91.2 90.1  PLT 150 811*   Basic Metabolic Panel: Recent Labs  Lab 08/22/19 2037 08/23/19 0823 08/24/19 1130  NA 125* 126* 126*  K 5.2* 4.5 4.6  CL 90* 90* 90*  CO2 22 23 22   GLUCOSE  122* 191* 118*  BUN 86* 88* 87*  CREATININE 3.05* 3.12* 3.30*  CALCIUM 8.8* 8.7* 8.7*  MG 3.0*  --  3.1*   GFR: CrCl cannot be calculated (Unknown ideal weight.). Liver Function Tests: Recent Labs  Lab 08/24/19 1130  AST 29  ALT 24  ALKPHOS 325*  BILITOT 1.2  PROT 5.3*  ALBUMIN 2.6*   No results for input(s): LIPASE, AMYLASE in the last 168 hours. No results for input(s): AMMONIA in the last 168 hours. Coagulation Profile: No results for input(s): INR, PROTIME in the last 168 hours. Cardiac Enzymes: No results for input(s): CKTOTAL, CKMB, CKMBINDEX, TROPONINI in the last 168 hours. BNP (last 3 results) No results for input(s): PROBNP in the last 8760 hours. HbA1C: Recent Labs    08/23/19 0823  HGBA1C 6.6*   CBG: Recent Labs  Lab 08/23/19 0759  08/23/19 1203 08/23/19 1634 08/23/19 2137 08/24/19 0620  GLUCAP 179* 217* 242* 217* 170*   Lipid Profile: No results for input(s): CHOL, HDL, LDLCALC, TRIG, CHOLHDL, LDLDIRECT in the last 72 hours. Thyroid Function Tests: Recent Labs    08/23/19 0823 08/24/19 1130  TSH 10.292*  --   FREET4  --  0.72   Anemia Panel: No results for input(s): VITAMINB12, FOLATE, FERRITIN, TIBC, IRON, RETICCTPCT in the last 72 hours. Urine analysis:    Component Value Date/Time   COLORURINE YELLOW 08/22/2019 2226   APPEARANCEUR HAZY (A) 08/22/2019 2226   LABSPEC 1.006 08/22/2019 2226   PHURINE 5.0 08/22/2019 2226   GLUCOSEU 50 (A) 08/22/2019 2226   HGBUR LARGE (A) 08/22/2019 2226   BILIRUBINUR NEGATIVE 08/22/2019 2226   KETONESUR NEGATIVE 08/22/2019 2226   PROTEINUR 30 (A) 08/22/2019 2226   NITRITE NEGATIVE 08/22/2019 2226   LEUKOCYTESUR SMALL (A) 08/22/2019 2226   Sepsis Labs: @LABRCNTIP (procalcitonin:4,lacticidven:4) )No results found for this or any previous visit (from the past 240 hour(s)).       Radiology Studies: US RENAL  Result Date: 08/22/2019 CLINICAL DATA:  Acute renal insufficiency. EXAM: RENAL / URINARY TRACT ULTRASOUND COMPLETE COMPARISON:  None. FINDINGS: Right Kidney: Renal measurements: 10.8 cm x 4.1 cm x 4.3 cm = volume: 99.9 mL. There is diffusely increased echogenicity of the renal parenchyma. No mass or hydronephrosis visualized. Left Kidney: Renal measurements: 8.4 cm x 3.8 cm x 4.1 cm = volume: 68.6 mL. Echogenicity within normal limits. No mass or hydronephrosis visualized. Bladder: A Foley catheter is within the urinary bladder. Other: There is a mild amount of abdominal free fluid. Bilateral pleural effusions are seen. IMPRESSION: 1. Increased echogenicity of the right kidney which may be secondary to medical renal disease. 2. Mild amount of abdominal free fluid. 3. Bilateral pleural effusions. Electronically Signed   By: Virgina Norfolk M.D.   On: 08/22/2019 22:11    DG Chest Port 1 View  Result Date: 08/23/2019 CLINICAL DATA:  CHF, shortness of breath, swelling in legs and feet, hypertension, diabetes mellitus EXAM: PORTABLE CHEST 1 VIEW COMPARISON:  Portable exam 0601 hours compared to 08/21/2019 FINDINGS: Enlargement of cardiac silhouette post CABG. Mediastinal contours normal with atherosclerotic calcification aorta. Slight pulmonary vascular congestion. Diffuse BILATERAL pulmonary infiltrates likely representing pulmonary edema. Bibasilar effusions and atelectasis. Progressive opacity in the retrocardiac LEFT lower lobe question which may represent atelectasis or consolidation. No pneumothorax. IMPRESSION: Enlargement of cardiac silhouette with vascular congestion and probable pulmonary edema. Bibasilar pleural effusions and atelectasis. New LEFT lower lobe opacification question atelectasis versus pneumonia. Electronically Signed   By: Lavonia Dana M.D.   On:  08/23/2019 08:29      Scheduled Meds: . atorvastatin  40 mg Oral Daily  . ferrous sulfate  325 mg Oral Daily  . furosemide  80 mg Intravenous BID  . heparin  5,000 Units Subcutaneous Q12H  . insulin aspart  0-6 Units Subcutaneous TID WC  . insulin glargine  10 Units Subcutaneous QHS  . levothyroxine  50 mcg Oral Q0600  . liver oil-zinc oxide   Topical QID  . omega-3 acid ethyl esters  1 g Oral Daily  . sodium chloride flush  3 mL Intravenous Q12H   Continuous Infusions: . sodium chloride       LOS: 2 days      Debbe Odea, MD [facility] Pager: [email or web address] 08/24/2019, 1:30 PM

## 2019-08-24 NOTE — Progress Notes (Signed)
Patient Refused to eat. Was rest most of the night and this morning.

## 2019-08-24 NOTE — Progress Notes (Signed)
  Echocardiogram 2D Echocardiogram has been performed.  Alicia Collins M 08/24/2019, 11:52 AM

## 2019-08-25 LAB — BASIC METABOLIC PANEL
Anion gap: 13 (ref 5–15)
BUN: 91 mg/dL — ABNORMAL HIGH (ref 8–23)
CO2: 25 mmol/L (ref 22–32)
Calcium: 9.2 mg/dL (ref 8.9–10.3)
Chloride: 89 mmol/L — ABNORMAL LOW (ref 98–111)
Creatinine, Ser: 3.25 mg/dL — ABNORMAL HIGH (ref 0.44–1.00)
GFR calc Af Amer: 15 mL/min — ABNORMAL LOW (ref >60)
GFR calc non Af Amer: 13 mL/min — ABNORMAL LOW (ref >60)
Glucose, Bld: 140 mg/dL — ABNORMAL HIGH (ref 70–99)
Potassium: 4.7 mmol/L (ref 3.5–5.1)
Sodium: 127 mmol/L — ABNORMAL LOW (ref 135–145)

## 2019-08-25 LAB — GLUCOSE, CAPILLARY
Glucose-Capillary: 135 mg/dL — ABNORMAL HIGH (ref 70–99)
Glucose-Capillary: 137 mg/dL — ABNORMAL HIGH (ref 70–99)
Glucose-Capillary: 168 mg/dL — ABNORMAL HIGH (ref 70–99)
Glucose-Capillary: 143 mg/dL — ABNORMAL HIGH (ref 70–99)
Glucose-Capillary: 157 mg/dL — ABNORMAL HIGH (ref 70–99)

## 2019-08-25 LAB — T3: T3, Total: 37 ng/dL — ABNORMAL LOW (ref 71–180)

## 2019-08-25 LAB — SARS CORONAVIRUS 2 BY RT PCR (HOSPITAL ORDER, PERFORMED IN ~~LOC~~ HOSPITAL LAB): SARS Coronavirus 2: NEGATIVE

## 2019-08-25 MED ORDER — CHLORHEXIDINE GLUCONATE CLOTH 2 % EX PADS
6.0000 | MEDICATED_PAD | Freq: Every day | CUTANEOUS | Status: DC
  Administered 2019-08-25 – 2019-08-27 (×3): 6 via TOPICAL

## 2019-08-25 MED ORDER — DIPHENHYDRAMINE HCL 25 MG PO CAPS
25.0000 mg | ORAL_CAPSULE | Freq: Two times a day (BID) | ORAL | Status: DC
  Administered 2019-08-25 – 2019-08-26 (×3): 25 mg via ORAL
  Filled 2019-08-25 (×3): qty 1

## 2019-08-25 NOTE — TOC Initial Note (Signed)
Transition of Care [facility]) - Initial/Assessment Note    Patient Details  Name: Alicia Collins MRN: [id number] Date of Birth: 1940/04/22  [facility]) CM/SW Contact:    Vinie Sill, [place] Phone Number: 08/25/2019, 4:57 PM  Clinical Narrative:                  CSW  Spoke with patient's daughter,Beverly via phone. CSW introduced self and explained role. CSW discussed PT recommendation of short term rehab at [facility]. Patient family states patient was at [facility] for 2 weeks, then Medicare stopped paying. Family's preferred SNF is [facility]. CSW advised if she returns back to SNF she may be in co-pay status. Patient's daughter states if so, the patient will discharge home. CSW explained the SNF process.   CSW sent SNF referral to [facility]- waiting on response. CSW will have weekend CSW follow up on referral.   CSW will start insurance authorization.  Thurmond Butts, MSW, [place] Clinical Social Worker   Expected Discharge Plan: Skilled Nursing Facility Barriers to Discharge: Continued Medical Work up, SNF Pending bed offer, Insurance Authorization   Patient Goals and CMS Choice        Expected Discharge Plan and Services Expected Discharge Plan: [place] In-house Referral: Clinical Social Work     Living arrangements for the past 2 months: Single Family Home                                      Prior Living Arrangements/Services Living arrangements for the past 2 months: Single Family Home Lives with:: Self, Spouse Patient language and need for interpreter reviewed:: No        Need for Family Participation in Patient Care: Yes (Comment)     Criminal Activity/Legal Involvement Pertinent to Current Situation/Hospitalization: No - Comment as needed  Activities of Daily Living      Permission Sought/Granted Permission sought to share information with : Family Supports Permission granted to share information  with : Yes, Verbal Permission Granted  Share Information with NAME: Shalom Ware  Permission granted to share info w AGENCY: SNFs  Permission granted to share info w Relationship: daughter  Permission granted to share info w Contact Information: [phone]  Emotional Assessment   Attitude/Demeanor/Rapport: Unable to Assess Affect (typically observed): Unable to Assess Orientation: : Oriented to Self Alcohol / Substance Use: Not Applicable Psych Involvement: No (comment)  Admission diagnosis:  CHF (congestive heart failure) (HCC) [I50.9] Acute kidney failure ([place]) [N17.9] Patient Active Problem List   Diagnosis Date Noted  . Hyponatremia 08/23/2019  . Type 2 diabetes mellitus with stage 3 chronic kidney disease ([place]) 08/23/2019  . Acute on chronic respiratory failure with hypoxia ([place]) 08/23/2019  . Hypothyroidism 08/23/2019  . Palliative care encounter 08/23/2019  . DNR (do not resuscitate) 08/23/2019  . Acute on chronic systolic CHF (congestive heart failure) ([place]) 08/22/2019  . AKI (acute kidney injury) ([place])   . Ischemic cardiomyopathy 05/25/2019  . Blood loss anemia 05/25/2019  . Hypertension   . Coronary artery disease involving coronary bypass graft of native heart without angina pectoris 08/14/2014  . Dyslipidemia 08/14/2014  . Essential hypertension 08/14/2014   PCP:  Nicholos Johns, MD Pharmacy:   [facility], [place] [address] Phone: [phone] Fax: [phone]     Social Determinants of Health (SDOH) Interventions  Readmission Risk Interventions No flowsheet data found.

## 2019-08-25 NOTE — NC FL2 (Signed)
[place] LEVEL OF CARE SCREENING TOOL     IDENTIFICATION  Patient Name: Alicia Collins Birthdate: 01-28-1941 Sex: female Admission Date (Current Location): 08/22/2019  [facility] Number:  [employer] and Address:  [facility] [place] [address] [place], [place] [address]      Provider Number: [id number]  Attending Physician Name and Address:  Debbe Odea, MD  Relative Name and Phone Number:  [name], daughter, [phone]    Current Level of Care: Hospital Recommended Level of Care: [place] Prior Approval Number:    Date Approved/Denied:   PASRR Number: [id number]  Discharge Plan: SNF    Current Diagnoses: Patient Active Problem List   Diagnosis Date Noted  . Hyponatremia 08/23/2019  . Type 2 diabetes mellitus with stage 3 chronic kidney disease ([place]) 08/23/2019  . Acute on chronic respiratory failure with hypoxia ([place]) 08/23/2019  . Hypothyroidism 08/23/2019  . Palliative care encounter 08/23/2019  . DNR (do not resuscitate) 08/23/2019  . Acute on chronic systolic CHF (congestive heart failure) ([place]) 08/22/2019  . AKI (acute kidney injury) ([place])   . Ischemic cardiomyopathy 05/25/2019  . Blood loss anemia 05/25/2019  . Hypertension   . Coronary artery disease involving coronary bypass graft of native heart without angina pectoris 08/14/2014  . Dyslipidemia 08/14/2014  . Essential hypertension 08/14/2014    Orientation RESPIRATION BLADDER Height & Weight     Self  O2 (Nasal cannula 2L) Incontinent, External catheter Weight: 173 lb (78.5 kg) Height:     BEHAVIORAL SYMPTOMS/MOOD NEUROLOGICAL BOWEL NUTRITION STATUS      Incontinent Diet (Please see DC Summary)  AMBULATORY STATUS COMMUNICATION OF NEEDS Skin   Extensive Assist Verbally Other (Comment) (non-pressure wound on leg with guaze dressing)                       Personal Care Assistance Level of Assistance  Bathing,  Feeding, Dressing Bathing Assistance: Maximum assistance Feeding assistance: Maximum assistance Dressing Assistance: Maximum assistance     Functional Limitations Info  Sight, Hearing, Speech Sight Info: Adequate Hearing Info: Adequate Speech Info: Adequate    SPECIAL CARE FACTORS FREQUENCY  PT (By licensed PT), OT (By licensed OT)     PT Frequency: 5x/week OT Frequency: 4x/week            Contractures Contractures Info: Not present    Additional Factors Info  Code Status, Allergies, Insulin Sliding Scale Code Status Info: DNR Allergies Info: Rosuvastatin, Penicillins   Insulin Sliding Scale Info: See DC Summary for dose       Current Medications (08/25/2019):  This is the current hospital active medication list Current Facility-Administered Medications  Medication Dose Route Frequency Provider Last Rate Last Admin  . 0.9 %  sodium chloride infusion  250 mL Intravenous PRN Wynetta Fines T, MD      . acetaminophen (TYLENOL) tablet 650 mg  650 mg Oral Q6H PRN Lequita Halt, MD   650 mg at 08/24/19 [phone]  . albuterol (PROVENTIL) (2.5 MG/3ML) 0.083% nebulizer solution 2.5 mg  2.5 mg Nebulization Q6H PRN Kathie Dike, MD   2.5 mg at 08/24/19 0836  . atorvastatin (LIPITOR) tablet 40 mg  40 mg Oral Daily Wynetta Fines T, MD   40 mg at 08/25/19 1001  . Chlorhexidine Gluconate Cloth 2 % PADS 6 each  6 each Topical Daily Debbe Odea, MD   6 each at 08/25/19 1002  . diphenhydrAMINE (BENADRYL)  capsule 25 mg  25 mg Oral BID Debbe Odea, MD      . ferrous sulfate tablet 325 mg  325 mg Oral Daily Wynetta Fines T, MD   325 mg at 08/25/19 1001  . furosemide (LASIX) injection 80 mg  80 mg Intravenous BID Kathie Dike, MD   80 mg at 08/25/19 1001  . heparin injection 5,000 Units  5,000 Units Subcutaneous Q12H Lequita Halt, MD   5,000 Units at 08/25/19 1001  . insulin aspart (novoLOG) injection 0-6 Units  0-6 Units Subcutaneous TID WC Opyd, Ilene Qua, MD   1 Units at 08/25/19 1229  .  insulin glargine (LANTUS) injection 10 Units  10 Units Subcutaneous QHS Lequita Halt, MD   10 Units at 08/23/19 2137  . levothyroxine (SYNTHROID) tablet 50 mcg  50 mcg Oral Q0600 Kathie Dike, MD   50 mcg at 08/25/19 0819  . liver oil-zinc oxide (DESITIN) 40 % ointment   Topical QID Kathie Dike, MD   Given at 08/25/19 1458  . omega-3 acid ethyl esters (LOVAZA) capsule 1 g  1 g Oral Daily Wynetta Fines T, MD   1 g at 08/25/19 1001  . ondansetron (ZOFRAN) injection 4 mg  4 mg Intravenous Q6H PRN Wynetta Fines T, MD   4 mg at 08/24/19 0837  . sodium chloride flush (NS) 0.9 % injection 3 mL  3 mL Intravenous Q12H Wynetta Fines T, MD   3 mL at 08/25/19 1008  . sodium chloride flush (NS) 0.9 % injection 3 mL  3 mL Intravenous PRN Lequita Halt, MD   3 mL at 08/22/19 2140     Discharge Medications: Please see discharge summary for a list of discharge medications.  Relevant Imaging Results:  Relevant Lab Results:   Additional Information SSN: [id number]  [place], [place]

## 2019-08-25 NOTE — Progress Notes (Signed)
Progress Note  Patient Name: Alicia Collins Adron Bene Date of Encounter: 08/25/2019  [facility] Cardiologist: Jenean Lindau, MD   Subjective   Sleeping on left side in bed.  Fairly nonconversant.  Arousable, states that her pill tasted bad.  Inpatient Medications    Scheduled Meds: . atorvastatin  40 mg Oral Daily  . Chlorhexidine Gluconate Cloth  6 each Topical Daily  . ferrous sulfate  325 mg Oral Daily  . furosemide  80 mg Intravenous BID  . heparin  5,000 Units Subcutaneous Q12H  . insulin aspart  0-6 Units Subcutaneous TID WC  . insulin glargine  10 Units Subcutaneous QHS  . levothyroxine  50 mcg Oral Q0600  . liver oil-zinc oxide   Topical QID  . omega-3 acid ethyl esters  1 g Oral Daily  . sodium chloride flush  3 mL Intravenous Q12H   Continuous Infusions: . sodium chloride     PRN Meds: sodium chloride, acetaminophen, albuterol, ondansetron (ZOFRAN) IV, sodium chloride flush   Vital Signs    Vitals:   08/24/19 2236 08/25/19 0114 08/25/19 0443 08/25/19 0847  BP: (!) 110/57 (!) 106/45 (!) 145/90 (!) 115/57  Pulse: 61 61 60 60  Resp:  20 18 18   Temp:      TempSrc:      SpO2:  100% 100% 100%  Weight:   78.5 kg     Intake/Output Summary (Last 24 hours) at 08/25/2019 1031 Last data filed at 08/25/2019 0800 Gross per 24 hour  Intake 340 ml  Output 1625 ml  Net -1285 ml   Last 3 Weights 08/25/2019 08/24/2019 08/23/2019  Weight (lbs) 173 lb 173 lb 174 lb 13.2 oz  Weight (kg) 78.472 kg 78.472 kg 79.3 kg      Telemetry    No adverse arrhythmias, heart rate 50s to 60s- Personally Reviewed  ECG    Sinus rhythm first-degree AV block nonspecific ST-T wave changes.- Personally Reviewed  Physical Exam   GEN: Frail, elderly HEENT: normal  Neck: no JVD, carotid bruits, or masses Cardiac: RRR; no murmurs, rubs, or gallops,no edema  Respiratory:  clear to auscultation bilaterally, normal work of breathing GI: soft, nontender, nondistended, + BS MS: no  deformity or atrophy  Skin: warm and dry, no rash Neuro: Nonfocal Psych: Mildly confused   Labs    High Sensitivity Troponin:  No results for input(s): TROPONINIHS in the last 720 hours.    Chemistry Recent Labs  Lab 08/23/19 0823 08/24/19 1130 08/25/19 0520  NA 126* 126* 127*  K 4.5 4.6 4.7  CL 90* 90* 89*  CO2 23 22 25   GLUCOSE 191* 118* 140*  BUN 88* 87* 91*  CREATININE 3.12* 3.30* 3.25*  CALCIUM 8.7* 8.7* 9.2  PROT  --  5.3*  --   ALBUMIN  --  2.6*  --   AST  --  29  --   ALT  --  24  --   ALKPHOS  --  325*  --   BILITOT  --  1.2  --   GFRNONAA 14* 13* 13*  GFRAA 16* 15* 15*  ANIONGAP 13 14 13      Hematology Recent Labs  Lab 08/23/19 0823 08/24/19 1130  WBC 7.3 6.7  RBC 2.83* 2.84*  HGB 8.5* 8.5*  HCT 25.8* 25.6*  MCV 91.2 90.1  MCH 30.0 29.9  MCHC 32.9 33.2  RDW 20.0* 19.8*  PLT 150 142*    BNPNo results for input(s): BNP, PROBNP in the last  168 hours.   DDimer No results for input(s): DDIMER in the last 168 hours.   Radiology    ECHOCARDIOGRAM COMPLETE  Result Date: 08/24/2019    ECHOCARDIOGRAM REPORT   Patient Name:   Alicia Collins [place] Date of Exam: 08/24/2019 Medical Rec #:  [id number]               Height: Accession #:    [id number]              Weight:       173.0 lb Date of Birth:  Sep 29, 1940               BSA:          1.833 m Patient Age:    79 years                BP:           145/73 mmHg Patient Gender: F                       HR:           62 bpm. Exam Location:  Inpatient Procedure: 2D Echo Indications:    CHF-Acute Systolic 557.32 / K02.54  History:        Patient has prior history of Echocardiogram examinations, most                 recent 05/11/2019. CAD; Risk Factors:Hypertension, Diabetes and                 Dyslipidemia. First-degree AV block nonspecific ST-T wave                 changes. Acute kidney injury, Dementia.  Sonographer:    Darlina Sicilian RDCS Referring Phys: [id number] [place] Comments: Image acquisition  challenging due to uncooperative patient. Exam ended per request IMPRESSIONS  1. Left ventricular ejection fraction, by estimation, is 45%. The left ventricle has mildly decreased function. The left ventricle has no regional Collins motion abnormalities, the basal to mid septum is severely hypokinetic. There is mild left ventricular  hypertrophy. Left ventricular diastolic parameters are consistent with Grade II diastolic dysfunction (pseudonormalization).  2. Right ventricular systolic function is moderately reduced. The right ventricular size is moderately enlarged. There is moderately elevated pulmonary artery systolic pressure. The estimated right ventricular systolic pressure is 62.8 mmHg. D-shaped interventricular septum suggestive of RV pressure/volume overload.  3. Left atrial size was mildly dilated.  4. Right atrial size was moderately dilated.  5. The mitral valve is normal in structure. Trivial mitral valve regurgitation. No evidence of mitral stenosis.  6. Tricuspid valve regurgitation is moderate to severe.  7. The aortic valve is tricuspid. Aortic valve regurgitation is not visualized. Mild aortic valve sclerosis is present, with no evidence of aortic valve stenosis.  8. The inferior vena cava is dilated in size with <50% respiratory variability, suggesting right atrial pressure of 15 mmHg.  9. There is a pleural effusion. FINDINGS  Left Ventricle: Left ventricular ejection fraction, by estimation, is 45%. The left ventricle has mildly decreased function. The left ventricle has no regional Collins motion abnormalities. The left ventricular internal cavity size was normal in size. There is mild left ventricular hypertrophy. Left ventricular diastolic parameters are consistent with Grade II diastolic dysfunction (pseudonormalization). Right Ventricle: The right ventricular size is moderately enlarged. No increase in right ventricular Collins thickness. Right ventricular systolic function is moderately reduced.  There  is moderately elevated pulmonary artery systolic pressure. The tricuspid  regurgitant velocity is 3.03 m/s, and with an assumed right atrial pressure of 15 mmHg, the estimated right ventricular systolic pressure is 41.7 mmHg. Left Atrium: Left atrial size was mildly dilated. Right Atrium: Right atrial size was moderately dilated. Pericardium: There is a pleural effusion. Trivial pericardial effusion is present. Mitral Valve: The mitral valve is normal in structure. Mild mitral annular calcification. Trivial mitral valve regurgitation. No evidence of mitral valve stenosis. Tricuspid Valve: The tricuspid valve is normal in structure. Tricuspid valve regurgitation is moderate to severe. Aortic Valve: The aortic valve is tricuspid. Aortic valve regurgitation is not visualized. Mild aortic valve sclerosis is present, with no evidence of aortic valve stenosis. Pulmonic Valve: The pulmonic valve was normal in structure. Pulmonic valve regurgitation is trivial. Aorta: The aortic root is normal in size and structure. Venous: The inferior vena cava is dilated in size with less than 50% respiratory variability, suggesting right atrial pressure of 15 mmHg. IAS/Shunts: No atrial level shunt detected by color flow Doppler.  LEFT VENTRICLE PLAX 2D LVIDd:         4.40 cm  Diastology LVIDs:         3.70 cm  LV e' lateral:   6.45 cm/s LV PW:         1.40 cm  LV E/e' lateral: 19.1 LV IVS:        1.00 cm  LV e' medial:    6.15 cm/s LVOT diam:     1.70 cm  LV E/e' medial:  20.0 LV SV:         56 LV SV Index:   31 LVOT Area:     2.27 cm  LEFT ATRIUM           Index LA diam:      4.30 cm 2.35 cm/m LA Vol (A4C): 43.6 ml 23.78 ml/m  AORTIC VALVE LVOT Vmax:   97.90 cm/s LVOT Vmean:  61.700 cm/s LVOT VTI:    0.247 m  AORTA Ao Root diam: 3.00 cm MITRAL VALVE                TRICUSPID VALVE MV Area (PHT): 5.84 cm     TR Peak grad:   36.7 mmHg MV Decel Time: 130 msec     TR Vmax:        303.00 cm/s MV E velocity: 123.00 cm/s MV A  velocity: 61.20 cm/s   SHUNTS MV E/A ratio:  2.01         Systemic VTI:  0.25 m                             Systemic Diam: 1.70 cm Loralie Champagne MD Electronically signed by Loralie Champagne MD Signature Date/Time: 08/24/2019/3:25:58 PM    Final     Cardiac Studies   Prior echo EF 35 to 40%  Current echo:  1. Left ventricular ejection fraction, by estimation, is 45%. The left  ventricle has mildly decreased function. The left ventricle has no  regional Collins motion abnormalities, the basal to mid septum is severely  hypokinetic. There is mild left ventricular  hypertrophy. Left ventricular diastolic parameters are consistent with  Grade II diastolic dysfunction (pseudonormalization).  2. Right ventricular systolic function is moderately reduced. The right  ventricular size is moderately enlarged. There is moderately elevated  pulmonary artery systolic pressure. The estimated right ventricular  systolic pressure is 51.7  mmHg. D-shaped  interventricular septum suggestive of RV pressure/volume overload.  3. Left atrial size was mildly dilated.  4. Right atrial size was moderately dilated.  5. The mitral valve is normal in structure. Trivial mitral valve  regurgitation. No evidence of mitral stenosis.  6. Tricuspid valve regurgitation is moderate to severe.  7. The aortic valve is tricuspid. Aortic valve regurgitation is not  visualized. Mild aortic valve sclerosis is present, with no evidence of  aortic valve stenosis.  8. The inferior vena cava is dilated in size with <50% respiratory  variability, suggesting right atrial pressure of 15 mmHg.  9. There is a pleural effusion.   Patient Profile     79 y.o. female with known CAD post CABG several years ago with ischemic cardiomyopathy EF 35 to 40%, cholecystitis with percutaneous drain, confusion with baseline mild dementia according past medical history.  Assessment & Plan    Acute on chronic systolic heart failure-EF 45% mildly  reduced with secondary pulmonary hypertension moderate. -Continue with IV Lasix 80 mg twice a day.  Meager output.  Creatinine has increased as well as BUN.  Clearly has a degree of AKI associated cardiorenal syndrome with her underlying illness. -Also has elevated right-sided pressures as well.  We will continue to attempt at diuresis but we may not be making much headway at this point. -Agree with palliative care assistance.  Agree with DNR.  Acute kidney injury -Creatinine continues to increase 3.12-baseline apparently 1.5.  Creatinine remaining fairly stable at 3.25.  BUN slightly climb from 87-91.  I think that tomorrow we will likely discontinue the IV Lasix.  I am not sure that we are making any significant progress with further diuresis and she does appear to be fairly euvolemic at this time.  Hyponatremia -Mild improvement once again.  We will try to avoid metolazone in the setting. -Continue with IV Lasix today with plans to discontinue tomorrow.  Fluid restriction.   For questions or updates, please contact [place] Please consult [email or web address] for contact info under        Signed, Candee Furbish, MD  08/25/2019, 10:31 AM

## 2019-08-25 NOTE — Progress Notes (Signed)
PROGRESS NOTE    Alicia Collins   GUY:[id number]  DOB: 1940/03/20  DOA: 08/22/2019 PCP: Nicholos Johns, MD   Brief Narrative:  Alicia Collins 79 y/o female with history of systolic dysfunction (LVEF 35 to 40%), CAD, chronic iron deficiency anemia, HTN, DM, CKD3, cholecystitis s/p cholecystostomy tube transferred to [facility] from [place] with acute on chronic systolic CHF, acute resp failure and AKI on CKD stage 3.     Subjective: She has no complaints  Assessment & Plan:   Principal Problem:   Acute on chronic systolic CHF (congestive heart failure) with acute respiratory failure Hypervolemic hyponatremia - cont diuretics per cardiology team- wean O2 as able -  ECHO 7/8> EF 45%, grade 2 d CHF, RV failure and mod to severe tricuspid regurg  Active Problems:   AKI- CKD 3 - Cr was 1.5 about a month ago - unfortunately, Cr is trending up > 3.0 - ? Cardiorenal  Periorbital edema - daughter states that this has happened in the past and Benadryl helps- I have ordered it  Elevated TSH- hypothyroid - Free T 4 is normal - cont current dose of Synthroid- 50 mcg and follow as outpatient  Type 2 diabetes mellitus with stage 3 chronic kidney disease (HCC)  - A1c 6.6- cont Lantus and SSI    Acute cholecystitis s/p drain - drain needs to be re-assess on 7/19 in [place]  Severe weakness - per daughter, the patient was recently at [facility] for about 2 wks and her insurance stopped paying- she was still not able to ambulate when discharged. She was in a wheelchair at home and was able to transition from bed to chair with help.  Poor oral intake - per RN and family, the patient is not eating anything in the hospital- follow  Time spent in minutes: 35 DVT prophylaxis: Heparin Code Status: DNR Family Communication: daughter Amanii Snethen Disposition Plan:  Status is: Inpatient  Remains inpatient appropriate because:receiving IV diuresis   Dispo: The patient is from: Home               Anticipated d/c is to: SNF              Anticipated d/c date is: 2 days              Patient currently is medically stable to d/c.  Consultants:   Cardiology  Palliative care Procedures:    1. Left ventricular ejection fraction, by estimation, is 45%. The left  ventricle has mildly decreased function. The left ventricle has no  regional wall motion abnormalities, the basal to mid septum is severely  hypokinetic. There is mild left ventricular  hypertrophy. Left ventricular diastolic parameters are consistent with  Grade II diastolic dysfunction (pseudonormalization).  2. Right ventricular systolic function is moderately reduced. The right  ventricular size is moderately enlarged. There is moderately elevated  pulmonary artery systolic pressure. The estimated right ventricular  systolic pressure is 56.3 mmHg. D-shaped  interventricular septum suggestive of RV pressure/volume overload.  3. Left atrial size was mildly dilated.  4. Right atrial size was moderately dilated.  5. The mitral valve is normal in structure. Trivial mitral valve  regurgitation. No evidence of mitral stenosis.  6. Tricuspid valve regurgitation is moderate to severe.  7. The aortic valve is tricuspid. Aortic valve regurgitation is not  visualized. Mild aortic valve sclerosis is present, with no evidence of  aortic valve stenosis.  8. The inferior vena cava is dilated in size  with <50% respiratory  variability, suggesting right atrial pressure of 15 mmHg.  9. There is a pleural effusion.  Antimicrobials:  Anti-infectives (From admission, onward)   None       Objective: Vitals:   08/25/19 0443 08/25/19 0847 08/25/19 1146 08/25/19 1252  BP: (!) 145/90 (!) 115/57 (!) 94/41 (!) 104/51  Pulse: 60 60 61 (!) 59  Resp: 18 18 16    Temp:      TempSrc:      SpO2: 100% 100% 97% 98%  Weight: 78.5 kg       Intake/Output Summary (Last 24 hours) at 08/25/2019 1521 Last data filed at 08/25/2019  1200 Gross per 24 hour  Intake 460 ml  Output 1625 ml  Net -1165 ml   Filed Weights   08/23/19 0415 08/24/19 0418 08/25/19 0443  Weight: 79.3 kg 78.5 kg 78.5 kg    Examination: General exam: Appears comfortable - sleepy HEENT: PERRLA, oral mucosa moist, no sclera icterus or thrush- periorbital edema noted Respiratory system: Clear to auscultation. Respiratory effort normal. Cardiovascular system: S1 & S2 heard,  No murmurs  Gastrointestinal system: Abdomen soft, non-tender, nondistended. Normal bowel sounds   Central nervous system: Alert and oriented to self and to place. No focal neurological deficits. Extremities: No cyanosis, clubbing or edema Skin: No rashes or ulcers Psychiatry:  Mood & affect appropriate.     Data Reviewed: I have personally reviewed following labs and imaging studies  CBC: Recent Labs  Lab 08/23/19 0823 08/24/19 1130  WBC 7.3 6.7  HGB 8.5* 8.5*  HCT 25.8* 25.6*  MCV 91.2 90.1  PLT 150 735*   Basic Metabolic Panel: Recent Labs  Lab 08/22/19 2037 08/23/19 0823 08/24/19 1130 08/25/19 0520  NA 125* 126* 126* 127*  K 5.2* 4.5 4.6 4.7  CL 90* 90* 90* 89*  CO2 22 23 22 25   GLUCOSE 122* 191* 118* 140*  BUN 86* 88* 87* 91*  CREATININE 3.05* 3.12* 3.30* 3.25*  CALCIUM 8.8* 8.7* 8.7* 9.2  MG 3.0*  --  3.1*  --    GFR: CrCl cannot be calculated (Unknown ideal weight.). Liver Function Tests: Recent Labs  Lab 08/24/19 1130  AST 29  ALT 24  ALKPHOS 325*  BILITOT 1.2  PROT 5.3*  ALBUMIN 2.6*   No results for input(s): LIPASE, AMYLASE in the last 168 hours. No results for input(s): AMMONIA in the last 168 hours. Coagulation Profile: No results for input(s): INR, PROTIME in the last 168 hours. Cardiac Enzymes: No results for input(s): CKTOTAL, CKMB, CKMBINDEX, TROPONINI in the last 168 hours. BNP (last 3 results) No results for input(s): PROBNP in the last 8760 hours. HbA1C: Recent Labs    08/23/19 0823  HGBA1C 6.6*    CBG: Recent Labs  Lab 08/24/19 1815 08/24/19 2039 08/25/19 0133 08/25/19 0625 08/25/19 1148  GLUCAP 72 81 135* 137* 168*   Lipid Profile: No results for input(s): CHOL, HDL, LDLCALC, TRIG, CHOLHDL, LDLDIRECT in the last 72 hours. Thyroid Function Tests: Recent Labs    08/23/19 0823 08/24/19 1130  TSH 10.292*  --   FREET4  --  0.72   Anemia Panel: No results for input(s): VITAMINB12, FOLATE, FERRITIN, TIBC, IRON, RETICCTPCT in the last 72 hours. Urine analysis:    Component Value Date/Time   COLORURINE YELLOW 08/22/2019 2226   APPEARANCEUR HAZY (A) 08/22/2019 2226   LABSPEC 1.006 08/22/2019 2226   PHURINE 5.0 08/22/2019 2226   GLUCOSEU 50 (A) 08/22/2019 2226   HGBUR LARGE (A) 08/22/2019 2226  BILIRUBINUR NEGATIVE 08/22/2019 [place] 08/22/2019 2226   PROTEINUR 30 (A) 08/22/2019 2226   NITRITE NEGATIVE 08/22/2019 2226   LEUKOCYTESUR SMALL (A) 08/22/2019 2226   Sepsis Labs: @LABRCNTIP (procalcitonin:4,lacticidven:4) ) Recent Results (from the past 240 hour(s))  SARS Coronavirus 2 by RT PCR (hospital order, performed in [facility] hospital lab) Nasopharyngeal Nasopharyngeal Swab     Status: None   Collection Time: 08/25/19  8:54 AM   Specimen: Nasopharyngeal Swab  Result Value Ref Range Status   SARS Coronavirus 2 NEGATIVE NEGATIVE Final    Comment: (NOTE) SARS-CoV-2 target nucleic acids are NOT DETECTED.  The SARS-CoV-2 RNA is generally detectable in upper and lower respiratory specimens during the acute phase of infection. The lowest concentration of SARS-CoV-2 viral copies this assay can detect is 250 copies / mL. A negative result does not preclude SARS-CoV-2 infection and should not be used as the sole basis for treatment or other patient management decisions.  A negative result may occur with improper specimen collection / handling, submission of specimen other than nasopharyngeal swab, presence of viral mutation(s) within the areas  targeted by this assay, and inadequate number of viral copies (<250 copies / mL). A negative result must be combined with clinical observations, patient history, and epidemiological information.  Fact Sheet for Patients:   StrictlyIdeas.no  Fact Sheet for Healthcare Providers: BankingDealers.co.za  This test is not yet approved or  cleared by the [place] FDA and has been authorized for detection and/or diagnosis of SARS-CoV-2 by FDA under an Emergency Use Authorization (EUA).  This EUA will remain in effect (meaning this test can be used) for the duration of the COVID-19 declaration under Section 564(b)(1) of the Act, 21 U.S.C. section 360bbb-3(b)(1), unless the authorization is terminated or revoked sooner.  Performed at [facility], [place] [address], [place], [place] [address]          Radiology Studies: ECHOCARDIOGRAM COMPLETE  Result Date: 08/24/2019    ECHOCARDIOGRAM REPORT   Patient Name:   Chrystine LEE WALL Balaguer Date of Exam: 08/24/2019 Medical Rec #:  [id number]               Height: Accession #:    [id number]              Weight:       173.0 lb Date of Birth:  11/11/1940               BSA:          1.833 m Patient Age:    58 years                BP:           145/73 mmHg Patient Gender: F                       HR:           62 bpm. Exam Location:  Inpatient Procedure: 2D Echo Indications:    CHF-Acute Systolic 737.10 / G26.94  History:        Patient has prior history of Echocardiogram examinations, most                 recent 05/11/2019. CAD; Risk Factors:Hypertension, Diabetes and                 Dyslipidemia. First-degree AV block nonspecific ST-T wave  changes. Acute kidney injury, Dementia.  Sonographer:    Darlina Sicilian RDCS Referring Phys: [id number] [place] Comments: Image acquisition challenging due to uncooperative patient. Exam ended per request IMPRESSIONS  1. Left ventricular  ejection fraction, by estimation, is 45%. The left ventricle has mildly decreased function. The left ventricle has no regional wall motion abnormalities, the basal to mid septum is severely hypokinetic. There is mild left ventricular  hypertrophy. Left ventricular diastolic parameters are consistent with Grade II diastolic dysfunction (pseudonormalization).  2. Right ventricular systolic function is moderately reduced. The right ventricular size is moderately enlarged. There is moderately elevated pulmonary artery systolic pressure. The estimated right ventricular systolic pressure is 26.8 mmHg. D-shaped interventricular septum suggestive of RV pressure/volume overload.  3. Left atrial size was mildly dilated.  4. Right atrial size was moderately dilated.  5. The mitral valve is normal in structure. Trivial mitral valve regurgitation. No evidence of mitral stenosis.  6. Tricuspid valve regurgitation is moderate to severe.  7. The aortic valve is tricuspid. Aortic valve regurgitation is not visualized. Mild aortic valve sclerosis is present, with no evidence of aortic valve stenosis.  8. The inferior vena cava is dilated in size with <50% respiratory variability, suggesting right atrial pressure of 15 mmHg.  9. There is a pleural effusion. FINDINGS  Left Ventricle: Left ventricular ejection fraction, by estimation, is 45%. The left ventricle has mildly decreased function. The left ventricle has no regional wall motion abnormalities. The left ventricular internal cavity size was normal in size. There is mild left ventricular hypertrophy. Left ventricular diastolic parameters are consistent with Grade II diastolic dysfunction (pseudonormalization). Right Ventricle: The right ventricular size is moderately enlarged. No increase in right ventricular wall thickness. Right ventricular systolic function is moderately reduced. There is moderately elevated pulmonary artery systolic pressure. The tricuspid  regurgitant  velocity is 3.03 m/s, and with an assumed right atrial pressure of 15 mmHg, the estimated right ventricular systolic pressure is 34.1 mmHg. Left Atrium: Left atrial size was mildly dilated. Right Atrium: Right atrial size was moderately dilated. Pericardium: There is a pleural effusion. Trivial pericardial effusion is present. Mitral Valve: The mitral valve is normal in structure. Mild mitral annular calcification. Trivial mitral valve regurgitation. No evidence of mitral valve stenosis. Tricuspid Valve: The tricuspid valve is normal in structure. Tricuspid valve regurgitation is moderate to severe. Aortic Valve: The aortic valve is tricuspid. Aortic valve regurgitation is not visualized. Mild aortic valve sclerosis is present, with no evidence of aortic valve stenosis. Pulmonic Valve: The pulmonic valve was normal in structure. Pulmonic valve regurgitation is trivial. Aorta: The aortic root is normal in size and structure. Venous: The inferior vena cava is dilated in size with less than 50% respiratory variability, suggesting right atrial pressure of 15 mmHg. IAS/Shunts: No atrial level shunt detected by color flow Doppler.  LEFT VENTRICLE PLAX 2D LVIDd:         4.40 cm  Diastology LVIDs:         3.70 cm  LV e' lateral:   6.45 cm/s LV PW:         1.40 cm  LV E/e' lateral: 19.1 LV IVS:        1.00 cm  LV e' medial:    6.15 cm/s LVOT diam:     1.70 cm  LV E/e' medial:  20.0 LV SV:         56 LV SV Index:   31 LVOT Area:  2.27 cm  LEFT ATRIUM           Index LA diam:      4.30 cm 2.35 cm/m LA Vol (A4C): 43.6 ml 23.78 ml/m  AORTIC VALVE LVOT Vmax:   97.90 cm/s LVOT Vmean:  61.700 cm/s LVOT VTI:    0.247 m  AORTA Ao Root diam: 3.00 cm MITRAL VALVE                TRICUSPID VALVE MV Area (PHT): 5.84 cm     TR Peak grad:   36.7 mmHg MV Decel Time: 130 msec     TR Vmax:        303.00 cm/s MV E velocity: 123.00 cm/s MV A velocity: 61.20 cm/s   SHUNTS MV E/A ratio:  2.01         Systemic VTI:  0.25 m                              Systemic Diam: 1.70 cm Loralie Champagne MD Electronically signed by Loralie Champagne MD Signature Date/Time: 08/24/2019/3:25:58 PM    Final       Scheduled Meds: . atorvastatin  40 mg Oral Daily  . Chlorhexidine Gluconate Cloth  6 each Topical Daily  . ferrous sulfate  325 mg Oral Daily  . furosemide  80 mg Intravenous BID  . heparin  5,000 Units Subcutaneous Q12H  . insulin aspart  0-6 Units Subcutaneous TID WC  . insulin glargine  10 Units Subcutaneous QHS  . levothyroxine  50 mcg Oral Q0600  . liver oil-zinc oxide   Topical QID  . omega-3 acid ethyl esters  1 g Oral Daily  . sodium chloride flush  3 mL Intravenous Q12H   Continuous Infusions: . sodium chloride       LOS: 3 days      Debbe Odea, MD [facility] Pager: [email or web address] 08/25/2019, 3:21 PM

## 2019-08-25 NOTE — Progress Notes (Signed)
Per CCMD, patient heart rate dropped to 26. Current vitals are as follows:   08/25/19 1252  Vitals  BP (!) 104/51  MAP (mmHg) 68  BP Location Left Arm  BP Method Automatic  Patient Position (if appropriate) Lying  Pulse Rate (!) 59  Pulse Rate Source Monitor  Oxygen Therapy  SpO2 98 %  O2 Device Nasal Cannula  O2 Flow Rate (L/min) 2 L/min  MEWS Score  MEWS Temp 0  MEWS Systolic 0  MEWS Pulse 0  MEWS RR 0  MEWS LOC 0  MEWS Score 0  MEWS Score Color Green   Patient asymptomatic. Barrett, PA paged.

## 2019-08-25 NOTE — Care Management Important Message (Signed)
Important Message  Patient Details  Name: Alicia Collins MRN: [id number] Date of Birth: 1940-11-23   Medicare Important Message Given:  Yes     Shelda Altes 08/25/2019, 8:23 AM

## 2019-08-26 DIAGNOSIS — Z7189 Other specified counseling: Secondary | ICD-10-CM

## 2019-08-26 DIAGNOSIS — K5900 Constipation, unspecified: Secondary | ICD-10-CM

## 2019-08-26 DIAGNOSIS — M6281 Muscle weakness (generalized): Secondary | ICD-10-CM

## 2019-08-26 DIAGNOSIS — R410 Disorientation, unspecified: Secondary | ICD-10-CM

## 2019-08-26 LAB — BASIC METABOLIC PANEL
Anion gap: 12 (ref 5–15)
BUN: 92 mg/dL — ABNORMAL HIGH (ref 8–23)
CO2: 25 mmol/L (ref 22–32)
Calcium: 9.1 mg/dL (ref 8.9–10.3)
Chloride: 92 mmol/L — ABNORMAL LOW (ref 98–111)
Creatinine, Ser: 3.3 mg/dL — ABNORMAL HIGH (ref 0.44–1.00)
GFR calc Af Amer: 15 mL/min — ABNORMAL LOW (ref >60)
GFR calc non Af Amer: 13 mL/min — ABNORMAL LOW (ref >60)
Glucose, Bld: 131 mg/dL — ABNORMAL HIGH (ref 70–99)
Potassium: 4.6 mmol/L (ref 3.5–5.1)
Sodium: 129 mmol/L — ABNORMAL LOW (ref 135–145)

## 2019-08-26 LAB — GLUCOSE, CAPILLARY
Glucose-Capillary: 109 mg/dL — ABNORMAL HIGH (ref 70–99)
Glucose-Capillary: 121 mg/dL — ABNORMAL HIGH (ref 70–99)
Glucose-Capillary: 97 mg/dL (ref 70–99)
Glucose-Capillary: 165 mg/dL — ABNORMAL HIGH (ref 70–99)

## 2019-08-26 MED ORDER — POLYETHYLENE GLYCOL 3350 17 G PO PACK
17.0000 g | PACK | Freq: Every day | ORAL | Status: DC
  Administered 2019-08-26 – 2019-08-27 (×2): 17 g via ORAL
  Filled 2019-08-26 (×2): qty 1

## 2019-08-26 MED ORDER — FENTANYL CITRATE (PF) 100 MCG/2ML IJ SOLN
12.5000 ug | INTRAMUSCULAR | Status: DC | PRN
  Administered 2019-08-27: 12.5 ug via INTRAVENOUS
  Filled 2019-08-26: qty 2

## 2019-08-26 MED ORDER — BISACODYL 5 MG PO TBEC: 5.0000 mg | DELAYED_RELEASE_TABLET | Freq: Every day | ORAL | Status: DC | PRN

## 2019-08-26 MED ORDER — LIDOCAINE 5 % EX PTCH
2.0000 | MEDICATED_PATCH | CUTANEOUS | Status: DC
  Administered 2019-08-26 – 2019-08-27 (×2): 2 via TRANSDERMAL
  Filled 2019-08-26 (×2): qty 2

## 2019-08-26 MED ORDER — FUROSEMIDE 40 MG PO TABS
40.0000 mg | ORAL_TABLET | Freq: Two times a day (BID) | ORAL | Status: DC
  Administered 2019-08-26 – 2019-08-27 (×2): 40 mg via ORAL
  Filled 2019-08-26 (×2): qty 1

## 2019-08-26 MED ORDER — BISACODYL 10 MG RE SUPP: 10.0000 mg | Freq: Every day | RECTAL | Status: DC | PRN

## 2019-08-26 MED ORDER — ACETAMINOPHEN 500 MG PO TABS
1000.0000 mg | ORAL_TABLET | Freq: Three times a day (TID) | ORAL | Status: DC
  Administered 2019-08-26 – 2019-08-27 (×4): 1000 mg via ORAL
  Filled 2019-08-26 (×4): qty 2

## 2019-08-26 NOTE — Progress Notes (Signed)
[facility])  Received request from Dr. Wynelle Cleveland for hospice services at home after discharge; [facility] Debbie aware.  Chart and pt information reviewed by [facility] physician.  Hospice eligibility confirmed at this time.  Hospital liaison spoke with [name], patient's daughter, to initiate education related to hospice philosophy and services and to answer any questions at this time. [name] verbalized understanding of information given.    [place]ase send signed and completed DNR home with pt/family.  Please provide prescriptions at discharge as needed to ensure ongoing symptom management.    DME needs discussed. Hospital bed, overbed table, and O2 concentrator required before discharge.  Order placed for delivery today. Address has been verified and is correct in the chart.  ACC information and contact numbers given to [facility].  Above information shared with Clide Cliff Manager.  Please call with any questions or concerns.  Thank you for the opportunity to participate in this pt's care.  Domenic Moras, BSN, RN [employer] [phone] (24h on call)

## 2019-08-26 NOTE — TOC Progression Note (Addendum)
Transition of Care [facility]) - Progression Note    Patient Details  Name: Alicia Collins MRN: [id number] Date of Birth: 1940/08/28  Transition of Care [facility]) CM/SW [place], [place] Phone Number: 08/26/2019, 9:14 AM  Clinical Narrative:      CSW spoke with Olivia Mackie from [employer] and she will not be able to look at patient until Monday. CSW let her know TOC team will follow up with her on Monday to see if she can accept patient. TOC team will need to find out if patient will have a copay at SNF if accepted and how much that will be, then follow up with patients family.  Pending bed offers. Pending Lexicographer # [id number].  TOC team will continue to follow. Expected Discharge Plan: [place] Barriers to Discharge: Continued Medical Work up, SNF Pending bed offer, Ship broker  Expected Discharge Plan and Services Expected Discharge Plan: [place] In-house Referral: Clinical Social Work     Living arrangements for the past 2 months: Single Family Home                                       Social Determinants of Health (SDOH) Interventions    Readmission Risk Interventions No flowsheet data found.

## 2019-08-26 NOTE — Progress Notes (Signed)
PROGRESS NOTE    Alicia Collins   BTD:[id number]  DOB: 05-15-1940  DOA: 08/22/2019 PCP: Nicholos Johns, MD   Brief Narrative:  Alicia Collins 79 y/o female with history of systolic dysfunction (LVEF 35 to 40%), CAD, chronic iron deficiency anemia, HTN, DM, CKD3, cholecystitis s/p cholecystostomy tube transferred to [facility] from [place] with acute on chronic systolic CHF, acute resp failure and AKI on CKD stage 3.     Subjective: She has no complaints.   Assessment & Plan:   Principal Problem:   Acute on chronic systolic CHF (congestive heart failure) with acute respiratory failure Hypervolemic hyponatremia -  ECHO 7/8> EF 45%, grade 2 d CHF, RV failure and mod to severe tricuspid regurg - IV diuretics stopped today by cardiology  Active Problems:   AKI- CKD 3 - Cr was 1.5 about a month ago - unfortunately, Cr is trending up > 3.0 -   Periorbital edema - daughter states that this has happened in the past and Benadryl helps- it was ordered yesterday and her edema has indeed improved- will d/c after today  Elevated TSH- hypothyroid - Free T 4 is normal - cont current dose of Synthroid- 50 mcg and follow as outpatient  Type 2 diabetes mellitus with stage 3 chronic kidney disease (HCC)  - A1c 6.6- cont Lantus and SSI    Acute cholecystitis s/p drain - drain needs to be re-assess on 7/19 in [place]  Severe weakness - per daughter, the patient was recently at [facility] for about 2 wks and her insurance stopped paying- she was still not able to ambulate when discharged. She was in a wheelchair at home and was able to transition from bed to chair with help.  Poor oral intake - per RN and family, the patient is not eating anything in the hospital- follow- today she seems to be eating a bit more  Disposition: palliative care consulted yesterday- today daughter had decided to transition patient to home with hospice which is understandable considering her decline, severe  generalized weakness and poor oral intake - awaiting hospice supplies to arrive at home - probable discharged tomorrow  Time spent in minutes: 35 DVT prophylaxis: Heparin Code Status: DNR Family Communication: daughter Lititia Sen Disposition Plan:  Status is: Inpatient  Remains inpatient appropriate because: awaiting hospice set up at home   Dispo: The patient is from: Home              Anticipated d/c is to: SNF              Anticipated d/c date is: 1 days              Patient currently is medically stable to d/c.  Consultants:   Cardiology  Palliative care Procedures:    1. Left ventricular ejection fraction, by estimation, is 45%. The left  ventricle has mildly decreased function. The left ventricle has no  regional wall motion abnormalities, the basal to mid septum is severely  hypokinetic. There is mild left ventricular  hypertrophy. Left ventricular diastolic parameters are consistent with  Grade II diastolic dysfunction (pseudonormalization).  2. Right ventricular systolic function is moderately reduced. The right  ventricular size is moderately enlarged. There is moderately elevated  pulmonary artery systolic pressure. The estimated right ventricular  systolic pressure is 36.4 mmHg. D-shaped  interventricular septum suggestive of RV pressure/volume overload.  3. Left atrial size was mildly dilated.  4. Right atrial size was moderately dilated.  5. The mitral  valve is normal in structure. Trivial mitral valve  regurgitation. No evidence of mitral stenosis.  6. Tricuspid valve regurgitation is moderate to severe.  7. The aortic valve is tricuspid. Aortic valve regurgitation is not  visualized. Mild aortic valve sclerosis is present, with no evidence of  aortic valve stenosis.  8. The inferior vena cava is dilated in size with <50% respiratory  variability, suggesting right atrial pressure of 15 mmHg.  9. There is a pleural effusion.  Antimicrobials:    Anti-infectives (From admission, onward)   None       Objective: Vitals:   08/25/19 1557 08/25/19 2022 08/26/19 0949 08/26/19 1212  BP: (!) 110/55 99/60 (!) 130/116 (!) 111/52  Pulse: (!) 57 64 (!) 57 (!) 54  Resp: 16 20 17 18   Temp:    (!) 97.4 F (36.3 C)  TempSrc:      SpO2: 92% 100% 92% 91%  Weight:  78.9 kg      Intake/Output Summary (Last 24 hours) at 08/26/2019 1521 Last data filed at 08/26/2019 1500 Gross per 24 hour  Intake 480 ml  Output 800 ml  Net -320 ml   Filed Weights   08/24/19 0418 08/25/19 0443 08/25/19 2022  Weight: 78.5 kg 78.5 kg 78.9 kg    Examination: General exam: Appears comfortable  HEENT: PERRLA, oral mucosa moist, no sclera icterus or thrush Respiratory system: Clear to auscultation. Respiratory effort normal. Cardiovascular system: S1 & S2 heard,  No murmurs  Gastrointestinal system: Abdomen soft, non-tender, nondistended. Normal bowel sounds   Central nervous system: Alert and oriented. No focal neurological deficits. Extremities: No cyanosis, clubbing or edema Skin: No rashes or ulcers Psychiatry:  Mood & affect appropriate.     Data Reviewed: I have personally reviewed following labs and imaging studies  CBC: Recent Labs  Lab 08/23/19 0823 08/24/19 1130  WBC 7.3 6.7  HGB 8.5* 8.5*  HCT 25.8* 25.6*  MCV 91.2 90.1  PLT 150 563*   Basic Metabolic Panel: Recent Labs  Lab 08/22/19 2037 08/23/19 0823 08/24/19 1130 08/25/19 0520 08/26/19 0418  NA 125* 126* 126* 127* 129*  K 5.2* 4.5 4.6 4.7 4.6  CL 90* 90* 90* 89* 92*  CO2 22 23 22 25 25   GLUCOSE 122* 191* 118* 140* 131*  BUN 86* 88* 87* 91* 92*  CREATININE 3.05* 3.12* 3.30* 3.25* 3.30*  CALCIUM 8.8* 8.7* 8.7* 9.2 9.1  MG 3.0*  --  3.1*  --   --    GFR: CrCl cannot be calculated (Unknown ideal weight.). Liver Function Tests: Recent Labs  Lab 08/24/19 1130  AST 29  ALT 24  ALKPHOS 325*  BILITOT 1.2  PROT 5.3*  ALBUMIN 2.6*   No results for input(s):  LIPASE, AMYLASE in the last 168 hours. No results for input(s): AMMONIA in the last 168 hours. Coagulation Profile: No results for input(s): INR, PROTIME in the last 168 hours. Cardiac Enzymes: No results for input(s): CKTOTAL, CKMB, CKMBINDEX, TROPONINI in the last 168 hours. BNP (last 3 results) No results for input(s): PROBNP in the last 8760 hours. HbA1C: No results for input(s): HGBA1C in the last 72 hours. CBG: Recent Labs  Lab 08/25/19 1148 08/25/19 1554 08/25/19 2131 08/26/19 0605 08/26/19 1120  GLUCAP 168* 143* 157* 109* 121*   Lipid Profile: No results for input(s): CHOL, HDL, LDLCALC, TRIG, CHOLHDL, LDLDIRECT in the last 72 hours. Thyroid Function Tests: Recent Labs    08/24/19 1130  FREET4 0.72   Anemia Panel: No results for  input(s): VITAMINB12, FOLATE, FERRITIN, TIBC, IRON, RETICCTPCT in the last 72 hours. Urine analysis:    Component Value Date/Time   COLORURINE YELLOW 08/22/2019 2226   APPEARANCEUR HAZY (A) 08/22/2019 2226   LABSPEC 1.006 08/22/2019 2226   PHURINE 5.0 08/22/2019 2226   GLUCOSEU 50 (A) 08/22/2019 2226   HGBUR LARGE (A) 08/22/2019 2226   BILIRUBINUR NEGATIVE 08/22/2019 2226   KETONESUR NEGATIVE 08/22/2019 2226   PROTEINUR 30 (A) 08/22/2019 2226   NITRITE NEGATIVE 08/22/2019 2226   LEUKOCYTESUR SMALL (A) 08/22/2019 2226   Sepsis Labs: @LABRCNTIP (procalcitonin:4,lacticidven:4) ) Recent Results (from the past 240 hour(s))  SARS Coronavirus 2 by RT PCR (hospital order, performed in [place] hospital lab) Nasopharyngeal Nasopharyngeal Swab     Status: None   Collection Time: 08/25/19  8:54 AM   Specimen: Nasopharyngeal Swab  Result Value Ref Range Status   SARS Coronavirus 2 NEGATIVE NEGATIVE Final    Comment: (NOTE) SARS-CoV-2 target nucleic acids are NOT DETECTED.  The SARS-CoV-2 RNA is generally detectable in upper and lower respiratory specimens during the acute phase of infection. The lowest concentration of SARS-CoV-2 viral  copies this assay can detect is 250 copies / mL. A negative result does not preclude SARS-CoV-2 infection and should not be used as the sole basis for treatment or other patient management decisions.  A negative result may occur with improper specimen collection / handling, submission of specimen other than nasopharyngeal swab, presence of viral mutation(s) within the areas targeted by this assay, and inadequate number of viral copies (<250 copies / mL). A negative result must be combined with clinical observations, patient history, and epidemiological information.  Fact Sheet for Patients:   StrictlyIdeas.no  Fact Sheet for Healthcare Providers: BankingDealers.co.za  This test is not yet approved or  cleared by the [place] FDA and has been authorized for detection and/or diagnosis of SARS-CoV-2 by FDA under an Emergency Use Authorization (EUA).  This EUA will remain in effect (meaning this test can be used) for the duration of the COVID-19 declaration under Section 564(b)(1) of the Act, 21 U.S.C. section 360bbb-3(b)(1), unless the authorization is terminated or revoked sooner.  Performed at [facility], [place] [address] [place], [place] [address]          Radiology Studies: No results found.    Scheduled Meds: . acetaminophen  1,000 mg Oral TID  . atorvastatin  40 mg Oral Daily  . Chlorhexidine Gluconate Cloth  6 each Topical Daily  . diphenhydrAMINE  25 mg Oral BID  . ferrous sulfate  325 mg Oral Daily  . furosemide  40 mg Oral BID  . heparin  5,000 Units Subcutaneous Q12H  . insulin aspart  0-6 Units Subcutaneous TID WC  . insulin glargine  10 Units Subcutaneous QHS  . levothyroxine  50 mcg Oral Q0600  . lidocaine  2 patch Transdermal Q24H  . liver oil-zinc oxide   Topical QID  . omega-3 acid ethyl esters  1 g Oral Daily  . polyethylene glycol  17 g Oral Daily  . sodium chloride flush  3 mL Intravenous  Q12H   Continuous Infusions: . sodium chloride       LOS: 4 days      Debbe Odea, MD [facility] Pager: [email or web address] 08/26/2019, 3:21 PM

## 2019-08-26 NOTE — TOC Initial Note (Signed)
Transition of Care [facility]) - Initial/Assessment Note    Patient Details  Name: Alicia Collins MRN: [id number] Date of Birth: [birth year]  [facility] of [facility] CM/SW Contact:    Alicia Collet, RN Phone Number: 08/26/2019, 2:18 PM  Clinical Narrative:         Alicia Collins w patient's daughter. Plan will be for DC to home w home hospice. Referral made to [facility] hospice by MD.   Alicia Collins w [facility] able to secure DME by tomorrow AM, including home O2.  Will plan for DC in AM. Address verified with patient's daughter, she would like transport arranged through [place] around 3pm tomorrow. Please verify [place] DNR form on chart.          Expected Discharge Plan: Skilled Nursing Facility Barriers to Discharge: No Barriers Identified   Patient Goals and CMS Choice Patient states their goals for this hospitalization and ongoing recovery are:: to go home w hospice, spoke w daughter CMS Medicare.gov Compare Post Acute Care list provided to:: Other (Comment Required) Choice offered to / list presented to : Adult Children  Expected Discharge Plan and Services Expected Discharge Plan: [place] In-house Referral: Clinical Social Work     Living arrangements for the past 2 months: [place]: Hospice and [place] Date [place]: 08/26/19   Representative spoke with at [place]: [place] Arrangements/Services Living arrangements for the past 2 months: [place] with:: Self, Spouse Patient language and need for interpreter reviewed:: No        Need for Family Participation in Patient Care: Yes (Comment)     Criminal Activity/Legal Involvement Pertinent to Current Situation/Hospitalization: No - Comment as needed  Activities of Daily Living      Permission Sought/Granted Permission sought to share information with : Family Supports Permission  granted to share information with : Yes, Verbal Permission Granted  Share Information with NAME: Alicia Collins  Permission granted to share info w AGENCY: SNFs  Permission granted to share info w Relationship: daughter  Permission granted to share info w Contact Information: [phone]  Emotional Assessment   Attitude/Demeanor/Rapport: Unable to Assess Affect (typically observed): Unable to Assess Orientation: : Oriented to Self Alcohol / Substance Use: Not Applicable Psych Involvement: No (comment)  Admission diagnosis:  CHF (congestive heart failure) (HCC) [I50.9] Acute kidney failure ([place]) [N17.9] Patient Active Problem List   Diagnosis Date Noted  . Hyponatremia 08/23/2019  . Type 2 diabetes mellitus with stage 3 chronic kidney disease ([place]) 08/23/2019  . Acute on chronic respiratory failure with hypoxia ([place]) 08/23/2019  . Hypothyroidism 08/23/2019  . Palliative care encounter 08/23/2019  . DNR (do not resuscitate) 08/23/2019  . Acute on chronic systolic CHF (congestive heart failure) ([place]) 08/22/2019  . AKI (acute kidney injury) ([place])   . Ischemic cardiomyopathy 05/25/2019  . Blood loss anemia 05/25/2019  . Hypertension   . Coronary artery disease involving coronary bypass graft of native heart without angina pectoris 08/14/2014  . Dyslipidemia 08/14/2014  . Essential hypertension 08/14/2014   PCP:  Alicia Johns, MD Pharmacy:   [facility], [address] [place] [address] Phone: [phone] Fax: [phone]     Social Determinants of Health (SDOH) Interventions    Readmission  Risk Interventions No flowsheet data found.

## 2019-08-26 NOTE — Progress Notes (Signed)
Progress Note  Patient Name: Alicia Collins Date of Encounter: 08/26/2019  [facility] Cardiologist: Jenean Lindau, MD   Subjective   More alert Nurse indicates she ate well this am Still with GB tube and foley in   Inpatient Medications    Scheduled Meds: . acetaminophen  1,000 mg Oral TID  . atorvastatin  40 mg Oral Daily  . Chlorhexidine Gluconate Cloth  6 each Topical Daily  . diphenhydrAMINE  25 mg Oral BID  . ferrous sulfate  325 mg Oral Daily  . furosemide  80 mg Intravenous BID  . heparin  5,000 Units Subcutaneous Q12H  . insulin aspart  0-6 Units Subcutaneous TID WC  . insulin glargine  10 Units Subcutaneous QHS  . levothyroxine  50 mcg Oral Q0600  . lidocaine  2 patch Transdermal Q24H  . liver oil-zinc oxide   Topical QID  . omega-3 acid ethyl esters  1 g Oral Daily  . polyethylene glycol  17 g Oral Daily  . sodium chloride flush  3 mL Intravenous Q12H   Continuous Infusions: . sodium chloride     PRN Meds: sodium chloride, albuterol, bisacodyl, bisacodyl, fentaNYL (SUBLIMAZE) injection, ondansetron (ZOFRAN) IV, sodium chloride flush   Vital Signs    Vitals:   08/25/19 1146 08/25/19 1252 08/25/19 1557 08/25/19 2022  BP: (!) 94/41 (!) 104/51 (!) 110/55 99/60  Pulse: 61 (!) 59 (!) 57 64  Resp: 16  16 20   Temp:      TempSrc:      SpO2: 97% 98% 92% 100%  Weight:    78.9 kg    Intake/Output Summary (Last 24 hours) at 08/26/2019 0946 Last data filed at 08/26/2019 0900 Gross per 24 hour  Intake 360 ml  Output 800 ml  Net -440 ml   Last 3 Weights 08/25/2019 08/25/2019 08/24/2019  Weight (lbs) 174 lb 173 lb 173 lb  Weight (kg) 78.926 kg 78.472 kg 78.472 kg      Telemetry    NSR 08/26/2019   ECG    Sinus rhythm first-degree AV block nonspecific ST-T wave changes.- Personally Reviewed  Physical Exam   Affect appropriate Frail elderly white female  HEENT: normal Neck supple with no adenopathy JVP normal no bruits no thyromegaly Lungs  clear with no wheezing and good diaphragmatic motion Heart:  S1/S2 no murmur, no rub, gallop or click PMI normal Abdomen: drainage tube GB Foley soft non tender  Distal pulses intact with no bruits No edema Neuro non-focal Skin warm and dry No muscular weakness    Labs    High Sensitivity Troponin:  No results for input(s): TROPONINIHS in the last 720 hours.    Chemistry Recent Labs  Lab 08/24/19 1130 08/25/19 0520 08/26/19 0418  NA 126* 127* 129*  K 4.6 4.7 4.6  CL 90* 89* 92*  CO2 22 25 25   GLUCOSE 118* 140* 131*  BUN 87* 91* 92*  CREATININE 3.30* 3.25* 3.30*  CALCIUM 8.7* 9.2 9.1  PROT 5.3*  --   --   ALBUMIN 2.6*  --   --   AST 29  --   --   ALT 24  --   --   ALKPHOS 325*  --   --   BILITOT 1.2  --   --   GFRNONAA 13* 13* 13*  GFRAA 15* 15* 15*  ANIONGAP 14 13 12      Hematology Recent Labs  Lab 08/23/19 0823 08/24/19 1130  WBC 7.3 6.7  RBC  2.83* 2.84*  HGB 8.5* 8.5*  HCT 25.8* 25.6*  MCV 91.2 90.1  MCH 30.0 29.9  MCHC 32.9 33.2  RDW 20.0* 19.8*  PLT 150 142*    BNPNo results for input(s): BNP, PROBNP in the last 168 hours.   DDimer No results for input(s): DDIMER in the last 168 hours.   Radiology    Cardiac Studies   Prior echo EF 35 to 40%  Current echo:  1. Left ventricular ejection fraction, by estimation, is 45%. The left  ventricle has mildly decreased function. The left ventricle has no  regional wall motion abnormalities, the basal to mid septum is severely  hypokinetic. There is mild left ventricular  hypertrophy. Left ventricular diastolic parameters are consistent with  Grade II diastolic dysfunction (pseudonormalization).  2. Right ventricular systolic function is moderately reduced. The right  ventricular size is moderately enlarged. There is moderately elevated  pulmonary artery systolic pressure. The estimated right ventricular  systolic pressure is 22.2 mmHg. D-shaped  interventricular septum suggestive of RV  pressure/volume overload.  3. Left atrial size was mildly dilated.  4. Right atrial size was moderately dilated.  5. The mitral valve is normal in structure. Trivial mitral valve  regurgitation. No evidence of mitral stenosis.  6. Tricuspid valve regurgitation is moderate to severe.  7. The aortic valve is tricuspid. Aortic valve regurgitation is not  visualized. Mild aortic valve sclerosis is present, with no evidence of  aortic valve stenosis.  8. The inferior vena cava is dilated in size with <50% respiratory  variability, suggesting right atrial pressure of 15 mmHg.  9. There is a pleural effusion.   Patient Profile     79 y.o. female with known CAD post CABG several years ago with ischemic cardiomyopathy EF 35 to 40%, cholecystitis with percutaneous drain, confusion with baseline mild dementia according past medical history. And now renal failure   Assessment & Plan    Acute on chronic systolic heart failure-EF 45% mildly reduced with secondary pulmonary hypertension moderate. -change to oral lasix at lower dose given renal issues  -Agree with palliative care assistance.  Agree with DNR.  Acute kidney injury -Cr 3.3 with 440 cc out foley in see above change to lower dose PO lasix   GB:  Still has tube in ? To be dealt with in [place] next week    For questions or updates, please contact [place] Please consult [email or web address] for contact info under        Signed, Jenkins Rouge, MD  08/26/2019, 9:46 AM

## 2019-08-26 NOTE — Consult Note (Signed)
Palliative Medicine Inpatient Consult Note  Reason for consult:  Goals of Care  HPI:  Per intake H&P --> Alicia Collins 79 y/o female with history of systolic dysfunction ([id number] to 40%),CAD, chronic iron deficiency anemia, HTN, DM, CKD3, cholecystitis s/p cholecystostomy tube transferred to [facility] from [place] with acute on chronic systolic CHF, acute resp failure and AKI on CKD stage 3.   Palliative care was asked to get involved to help address goals of care.  Clinical Assessment/Goals of Care: I have reviewed medical records including EPIC notes, labs and imaging, received report from bedside RN, assessed the patient. Upon bedside assessment patient was noted to be in quite a bit of hip pain. Her bedside RN and I discussed this to identify a better plan for pain management. We also discussed constipation management.    I called Cherilynn's daughter, Belem Hintze to further discuss diagnosis prognosis, GOC, EOL wishes, disposition and options.   I introduced Palliative Medicine as specialized medical care for people living with serious illness. It focuses on providing relief from the symptoms and stress of a serious illness. The goal is to improve quality of life for both the patient and the family.  I asked [name] to tell me about her mother. She shares that she was born and raised in [place], [place]. She has been married to her husband for 68 years. She has two children, a son and a daughter. 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 She is a member of a [employer].  Prior to about three years ago Shamikia had a very robust life. She was active in performing her bADLs. One day when she was out pulling weeds she got up and was noted to fracture her lower leg. Since then she has overtime become more debilitated. Her daughter shares that she has been suffering for sometime now. She requires assistance  with all ADLs and is wheelchair dependant. From a quality of life perspective this too has deteriorated as Rylie use to get great joy from cooking and cleaning. Her husband now helps her to and from the bathroom. [name] shares that she knows her mother and traveling into and out of the hospital is not something that she would likely have wanted.  I introduced the concept of hospice which would enable Alicia Collins to stay at home with supportive care a few times a week. She shares that this is what she believes that her mother would want at this juncture. We discussed hospice as a benefit for patients who have a prognosis of < 6 months to live. Their goal is for preservation of quality and dignity at the end of life.   A detailed discussion was had today regarding advanced directives, Shara has a living will at home per her daughter.    Concepts specific to code status, artifical feeding and hydration, continued IV antibiotics and rehospitalization was had.  The difference between a aggressive medical intervention path  and a palliative comfort care path for this patient at this time was had. Values and goals of care important to patient and family were attempted to be elicited. An electronic MOST was completed.  Cardiopulmonary Resuscitation: Do Not Attempt Resuscitation (DNR/No CPR)  Medical Interventions: Limited Additional Interventions: Use medical treatment, IV fluids and cardiac monitoring as indicated, DO NOT USE intubation or mechanical ventilation. May consider use of less invasive airway support such as BiPAP or CPAP. Also provide comfort  measures. Transfer to the hospital if indicated. Avoid intensive care.   Antibiotics: Determine use of limitation of antibiotics when infection occurs  IV Fluids: IV fluids for a defined trial period  Feeding Tube: Feeding tube for a defined trial period   Discussed the importance of continued conversation with family and their  medical providers regarding overall  plan of care and treatment options, ensuring decisions are within the context of the patients values and GOCs.  Provided "Hard Choices for [employer]" booklet.   Decision Maker: Emerson Monte [phone]  SUMMARY OF RECOMMENDATIONS   DNAR/DNI  TOC --> Home with Hospice  E-MOST form in [facility]  Symptom management as below  Code Status/Advance Care Planning: DNAR/DNI   Symptom Management:  Generalized Pain:  - Tylenol 1g PO TID  - Fentanyl 12.39mcg Q4H PRN  - Lidocaine patches to hips on for 12 hours off for 12 hours  Constipation:                 - Miralax 17g PO Qday  - Bisacodyl 5mg  PO Qday PRN  - Bisacodyl 10mg  PR Qday PRN   Muscular Weakness:                 - Physical Therapy Evaluation                 - Occupational Therapy Evaluation  Delirium:                 - Delirium precautions                 - Get up during the day                 - Encourage a familiar face to remain present throughout the day                 - Keep blinds open and lights on during daylight hours                 - Minimize the use of opioids/benzodiazepines   Spiritual:                 - Chaplain consult   Palliative Prophylaxis:   Oral Care, Mobility  Additional Recommendations (Limitations, Scope, Preferences):  Continue with current scope of treatment   Psycho-social/Spiritual:   Desire for further Chaplaincy support: Yes - Strong belief in God  Additional Recommendations: Education on OP Palliative Care   Prognosis: UTD  Discharge Planning: Discharge either to SNF or home with OP Palliative  PPS: 30-40%    This conversation/these recommendations were discussed with patient primary care team, Dr.Rizwan  Time In: 0645 Time Out: 0800 Total Time: 75 Greater than 50%  of this time was spent counseling and coordinating care related to the above assessment and plan.  [place] Team Team Cell Phone: [phone] Please  utilize secure chat with additional questions, if there is no response within 30 minutes please call the above phone number  Palliative Medicine Team providers are available by phone from 7am to 7pm daily and can be reached through the team cell phone.  Should this patient require assistance outside of these hours, please call the patient's attending physician.

## 2019-08-27 DIAGNOSIS — Z515 Encounter for palliative care: Secondary | ICD-10-CM

## 2019-08-27 DIAGNOSIS — Z66 Do not resuscitate: Secondary | ICD-10-CM

## 2019-08-27 DIAGNOSIS — I10 Essential (primary) hypertension: Secondary | ICD-10-CM

## 2019-08-27 DIAGNOSIS — I25810 Atherosclerosis of coronary artery bypass graft(s) without angina pectoris: Secondary | ICD-10-CM

## 2019-08-27 DIAGNOSIS — I255 Ischemic cardiomyopathy: Secondary | ICD-10-CM

## 2019-08-27 DIAGNOSIS — I5023 Acute on chronic systolic (congestive) heart failure: Secondary | ICD-10-CM

## 2019-08-27 LAB — BASIC METABOLIC PANEL
Anion gap: 13 (ref 5–15)
BUN: 96 mg/dL — ABNORMAL HIGH (ref 8–23)
CO2: 26 mmol/L (ref 22–32)
Calcium: 9.1 mg/dL (ref 8.9–10.3)
Chloride: 89 mmol/L — ABNORMAL LOW (ref 98–111)
Creatinine, Ser: 3.51 mg/dL — ABNORMAL HIGH (ref 0.44–1.00)
GFR calc Af Amer: 14 mL/min — ABNORMAL LOW (ref >60)
GFR calc non Af Amer: 12 mL/min — ABNORMAL LOW (ref >60)
Glucose, Bld: 120 mg/dL — ABNORMAL HIGH (ref 70–99)
Potassium: 4.5 mmol/L (ref 3.5–5.1)
Sodium: 128 mmol/L — ABNORMAL LOW (ref 135–145)

## 2019-08-27 LAB — GLUCOSE, CAPILLARY
Glucose-Capillary: 126 mg/dL — ABNORMAL HIGH (ref 70–99)
Glucose-Capillary: 66 mg/dL — ABNORMAL LOW (ref 70–99)
Glucose-Capillary: 139 mg/dL — ABNORMAL HIGH (ref 70–99)

## 2019-08-27 MED ORDER — LIDOCAINE 5 % EX PTCH: 2.0000 | MEDICATED_PATCH | CUTANEOUS | 0 refills | Status: AC

## 2019-08-27 MED ORDER — LEVOTHYROXINE SODIUM 50 MCG PO TABS: 50.0000 ug | ORAL_TABLET | Freq: Every day | ORAL | 0 refills | Status: AC

## 2019-08-27 MED ORDER — ZINC OXIDE 40 % EX OINT: TOPICAL_OINTMENT | Freq: Four times a day (QID) | CUTANEOUS | 0 refills | Status: AC

## 2019-08-27 MED ORDER — POLYETHYLENE GLYCOL 3350 17 G PO PACK: 17.0000 g | PACK | Freq: Every day | ORAL | 0 refills | Status: AC

## 2019-08-27 MED ORDER — BISACODYL 10 MG RE SUPP: 10.0000 mg | Freq: Every day | RECTAL | 0 refills | Status: AC | PRN

## 2019-08-27 MED ORDER — TOUJEO SOLOSTAR 300 UNIT/ML ~~LOC~~ SOPN: 10.0000 [IU] | PEN_INJECTOR | Freq: Every day | SUBCUTANEOUS | Status: AC

## 2019-08-27 MED ORDER — BISACODYL 5 MG PO TBEC: 5.0000 mg | DELAYED_RELEASE_TABLET | Freq: Every day | ORAL | 0 refills | Status: AC | PRN

## 2019-08-27 MED ORDER — DIPHENHYDRAMINE HCL 50 MG/ML IJ SOLN
12.5000 mg | Freq: Once | INTRAMUSCULAR | Status: AC
  Administered 2019-08-27: 12.5 mg via INTRAVENOUS
  Filled 2019-08-27: qty 1

## 2019-08-27 MED ORDER — DEXTROSE 50 % IV SOLN
INTRAVENOUS | Status: AC
  Filled 2019-08-27: qty 50

## 2019-08-27 NOTE — Progress Notes (Addendum)
   Palliative Medicine Inpatient Follow Up Note   Reason for consult:  Goals of Care  HPI:  Per intake H&P --> Alicia Collins y/o female with history of systolic dysfunction ([id number] to 40%),CAD, chronic iron deficiency anemia, HTN, DM, CKD3, cholecystitis s/p cholecystostomy tube transferred to [facility] from [place] with acute on chronic systolic CHF,acute resp failure and AKI on CKD stage 3.  Palliative care was asked to get involved to help address goals of care.  Today's Discussion (08/27/2019): Chart reviewed. I met with Alicia Collins and her bedside RN, Jana Half this morning.Per Jana Half patient has been pulling at her tele leads, we have since [name] them. She was also noted to have itching. Being that our ultimate goal is comfort and symptom relief we plan to provide benadryl to aid in relief of this.  Plan for transition to home today with Hospice Care this afternoon.  Discussed the importance of continued conversation with family and their  medical providers regarding overall plan of care and treatment options, ensuring decisions are within the context of the patients values and GOCs.  Provided  "Hard Choices for [employer]" booklet.   Questions and concerns addressed   SUMMARY OF RECOMMENDATIONS   DNAR/DNI  E-MOST form in Vynca  DNR Placed on the chart  Home with [facility] today  Symptom management as below  Code Status/Advance Care Planning: DNAR/DNI  Symptom Management: Generalized Pain:                 - Tylenol 1g PO TID                 - Fentanyl 12.69mg Q4H PRN                 - Lidocaine patches to hips on for 12 hours off for 12 hours  Constipation:     - Miralax 17g PO Qday                 - Bisacodyl 544mPO Qday PRN                 - Bisacodyl 1077mR Qday PRN  Muscular Weakness: - ROM exercises  Itching:  - Benadryl 12.5mg65m x1  Delirium: - Delirium precautions -  Get up during the day - Encourage a familiar face to remain present throughout the day - Keep blinds open and lights on during daylight hours - Minimize the use of opioids/benzodiazepines  - Remove unneeded lines/monitoring  Spiritual: - Chaplain consult  Time Spent: 25 Greater than 50% of the time was spent in counseling and coordination of care ______________________________________________________________________________________ [place] Team Cell Phone: [phone] utilize secure chat with additional questions, if there is no response within 30 minutes please call the above phone number  Palliative Medicine Team providers are available by phone from 7am to 7pm daily and can be reached through the team cell phone.  Should this patient require assistance outside of these hours, please call the patient's attending physician.

## 2019-08-27 NOTE — Progress Notes (Signed)
Called daughter to discuss discharge information, questions answered. Iv removed, waiting for PTAR.

## 2019-08-27 NOTE — TOC Transition Note (Signed)
Transition of Care [facility]) - CM/SW Discharge Note   Patient Details  Name: Alicia Collins MRN: [id number] Date of Birth: 1941-02-14  [facility] CM/SW Contact:  Carles Collet, RN Phone Number: 08/27/2019, 11:48 AM   Clinical Narrative:    Damaris Schooner w daughter. Equipment delivered. They are ready for patient at 3pm via PTAR. Address verified Forms on chart. Nurse aware. Hospice aware and has scheduled intake for 10am tomorrow per daughter     Final next level of care: Home w Hospice Care Barriers to Discharge: No Barriers Identified   Patient Goals and CMS Choice Patient states their goals for this hospitalization and ongoing recovery are:: to go home w hospice, spoke w daughter CMS Medicare.gov Compare Post Acute Care list provided to:: Other (Comment Required) Choice offered to / list presented to : Adult Children  Discharge Placement                       Discharge Plan and Services In-house Referral: Clinical Social Work                          [facility] Agency: Hospice and [place] Date [place]: 08/26/19   Representative spoke with at [place]: [place]  Social Determinants of Health ([place]) Interventions     Readmission Risk Interventions No flowsheet data found.

## 2019-08-27 NOTE — Discharge Summary (Signed)
Physician Discharge Summary  Alicia Collins JWJ:[id number] DOB: [birth year] DOA: 08/22/2019  PCP: Nicholos Johns, MD  Admit date: 08/22/2019 Discharge date: 08/27/2019  Admitted From: home Disposition:  home   Recommendations for Outpatient Follow-up:  1. Home with hospice services 2. Cholecystostomy drain needs to be removed at [facility] (scheduled for follow up on 7/19)    Discharge Condition:  stable   CODE STATUS:  DNR   Diet recommendation:  Diet as tolerated Consultations:  Cardiology  Palliative care  Procedures/Studies: 2  D ECHO   1. Left ventricular ejection fraction, by estimation, is 45%. The left  ventricle has mildly decreased function. The left ventricle has no  regional Collins motion abnormalities, the basal to mid septum is severely  hypokinetic. There is mild left ventricular  hypertrophy. Left ventricular diastolic parameters are consistent with  Grade II diastolic dysfunction (pseudonormalization).  2. Right ventricular systolic function is moderately reduced. The right  ventricular size is moderately enlarged. There is moderately elevated  pulmonary artery systolic pressure. The estimated right ventricular  systolic pressure is 62.1 mmHg. D-shaped  interventricular septum suggestive of RV pressure/volume overload.  3. Left atrial size was mildly dilated.  4. Right atrial size was moderately dilated.  5. The mitral valve is normal in structure. Trivial mitral valve  regurgitation. No evidence of mitral stenosis.  6. Tricuspid valve regurgitation is moderate to severe.  7. The aortic valve is tricuspid. Aortic valve regurgitation is not  visualized. Mild aortic valve sclerosis is present, with no evidence of  aortic valve stenosis.  8. The inferior vena cava is dilated in size with <50% respiratory  variability, suggesting right atrial pressure of 15 mmHg.  9. There is a pleural effusion.   Discharge Diagnoses:  Principal Problem:   Acute on  chronic systolic CHF (congestive heart failure) (HCC) Active Problems:   Coronary artery disease involving coronary bypass graft of native heart without angina pectoris   Essential hypertension   Ischemic cardiomyopathy   AKI (acute kidney injury) ([place])   Hyponatremia   Type 2 diabetes mellitus with stage 3 chronic kidney disease (HCC)   Acute on chronic respiratory failure with hypoxia ([place])   Hypothyroidism   Palliative care encounter   DNR (do not resuscitate)     Brief Summary: Alicia Collins 79 y/o female with history of systolic dysfunction ([id number] to 40%),CAD, chronic iron deficiency anemia, HTN, DM, CKD3, cholecystitis s/p cholecystostomy tube transferred to [facility] from [place] with acute on chronic systolic CHF, acute resp failure and AKI on CKD stage 3.    Hospital Course:  Principal Problem:   Acute on chronic systolic CHF (congestive heart failure) with acute respiratory failure Hypervolemic hyponatremia -  ECHO 7/8> EF 45%, grade 2 d CHF, RV failure and mod to severe tricuspid regurg - IV diuretics stopped on 7/10 by cardiology  Active Problems:   AKI- CKD 3 - Cr was 1.5 about a month ago - unfortunately, Cr is trending up > 3.0 -   Periorbital edema - daughter states that this has happened in the past and Benadryl helps- it was ordered yesterday and her edema has indeed improved- will d/c after today  Elevated TSH- hypothyroid - Free T 4 is normal - cont current dose of Synthroid- 50 mcg and follow as outpatient  Type 2 diabetes mellitus with stage 3 chronic kidney disease (HCC)  - A1c 6.6- cont Lantus and SSI    Acute cholecystitis s/p drain - drain needs to be re-assess  on 7/19 in   Severe weakness - per daughter, the patient was recently at [facility] for about 2 wks and her insurance stopped paying- she was still not able to ambulate when discharged. She was in a wheelchair at home and was able to transition from bed to chair with  help.  Poor oral intake - per RN and family, the patient is not eating anything in the hospital- follow- today she seems to be eating a bit more  Disposition: palliative care consulted on 7/9 for [place]- on 7/10, her daughter decided to transition patient to home with hospice which is understandable considering her decline over the past few month with severe generalized weakness and poor oral intake    Discharge Exam: Vitals:   08/26/19 2034 08/27/19 0314  BP: (!) 141/60 (!) 122/44  Pulse: (!) 55 (!) 51  Resp: 17 16  Temp:    SpO2: 100% 98%   Vitals:   08/26/19 1212 08/26/19 2034 08/27/19 0043 08/27/19 0314  BP: (!) 111/52 (!) 141/60  (!) 122/44  Pulse: (!) 54 (!) 55  (!) 51  Resp: 18 17  16   Temp: (!) 97.4 F (36.3 C)     TempSrc:      SpO2: 91% 100%  98%  Weight:   80.3 kg     General: Pt is alert, awake, not in acute distress Cardiovascular: RRR, S1/S2 +, no rubs, no gallops Respiratory: CTA bilaterally, no wheezing, no rhonchi Abdominal: Soft, NT, ND, bowel sounds + Extremities: no edema, no cyanosis   Discharge Instructions  Discharge Instructions    No wound care   Complete by: As directed      Allergies as of 08/27/2019      Reactions   Rosuvastatin    unknown   Penicillins Rash      Medication List    STOP taking these medications   ferrous sulfate 325 (65 FE) MG tablet   lisinopril 40 MG tablet Commonly known as: ZESTRIL   metFORMIN 500 MG tablet Commonly known as: GLUCOPHAGE     TAKE these medications   acetaminophen 325 MG tablet Commonly known as: TYLENOL Take 650 mg by mouth every 6 (six) hours as needed for moderate pain.   aspirin EC 81 MG tablet Take 81 mg by mouth daily. Swallow whole.   bisacodyl 5 MG EC tablet Commonly known as: DULCOLAX Take 1 tablet (5 mg total) by mouth daily as needed for moderate constipation.   bisacodyl 10 MG suppository Commonly known as: DULCOLAX Place 1 suppository (10 mg total) rectally daily as  needed (Constipation).   CALCIUM PO Take 1 tablet by mouth daily.   carvedilol 12.5 MG tablet Commonly known as: COREG Take 12.5 mg by mouth 2 (two) times daily with a meal. What changed: Another medication with the same name was removed. Continue taking this medication, and follow the directions you see here.   Farxiga 10 MG Tabs tablet Generic drug: dapagliflozin propanediol Take 10 mg by mouth daily.   furosemide 20 MG tablet Commonly known as: LASIX Take 40 mg by mouth 2 (two) times daily.   GLUCOSAMINE CHONDR COMPLEX PO Take by mouth.   levothyroxine 50 MCG tablet Commonly known as: SYNTHROID Take 1 tablet (50 mcg total) by mouth daily at 6 (six) AM. Start taking on: August 28, 2019   lidocaine 5 % Commonly known as: LIDODERM Place 2 patches onto the skin daily. Remove & Discard patch within 12 hours or as directed by MD Start taking  on: August 28, 2019   liver oil-zinc oxide 40 % ointment Commonly known as: DESITIN Apply topically 4 (four) times daily.   magnesium oxide 400 (241.3 Mg) MG tablet Commonly known as: MAG-OX Take 1 tablet by mouth 2 (two) times daily.   nitroGLYCERIN 0.4 MG SL tablet Commonly known as: NITROSTAT Place under the tongue.   OMEGA-3 FISH OIL PO Take by mouth.   OVER THE COUNTER MEDICATION Take 1 tablet by mouth daily. Medication: Advanced eye complex   pantoprazole 40 MG tablet Commonly known as: PROTONIX Take 40 mg by mouth daily.   polyethylene glycol 17 g packet Commonly known as: MIRALAX / GLYCOLAX Take 17 g by mouth daily. Start taking on: August 28, 2019   simvastatin 80 MG tablet Commonly known as: ZOCOR Take 80 mg by mouth at bedtime.   Toujeo SoloStar 300 UNIT/ML Solostar Pen Generic drug: insulin glargine (1 Unit Dial) Inject 10 Units into the skin at bedtime. Per sliding scale What changed: how much to take   traMADol 50 MG tablet Commonly known as: ULTRAM Take 50-100 mg by mouth every 6 (six) hours as needed for  moderate pain. Take 50 mg tablet by mouth when pain starts, if continues give another 50 mg tablet per daughter   Vitamin D (Ergocalciferol) 1.25 MG (50000 UNIT) Caps capsule Commonly known as: DRISDOL Take 50,000 Units by mouth every 7 (seven) days.       Allergies  Allergen Reactions  . Rosuvastatin     unknown  . Penicillins Rash      US RENAL  Result Date: 08/22/2019 CLINICAL DATA:  Acute renal insufficiency. EXAM: RENAL / URINARY TRACT ULTRASOUND COMPLETE COMPARISON:  None. FINDINGS: Right Kidney: Renal measurements: 10.8 cm x 4.1 cm x 4.3 cm = volume: 99.9 mL. There is diffusely increased echogenicity of the renal parenchyma. No mass or hydronephrosis visualized. Left Kidney: Renal measurements: 8.4 cm x 3.8 cm x 4.1 cm = volume: 68.6 mL. Echogenicity within normal limits. No mass or hydronephrosis visualized. Bladder: A Foley catheter is within the urinary bladder. Other: There is a mild amount of abdominal free fluid. Bilateral pleural effusions are seen. IMPRESSION: 1. Increased echogenicity of the right kidney which may be secondary to medical renal disease. 2. Mild amount of abdominal free fluid. 3. Bilateral pleural effusions. Electronically Signed   By: Virgina Norfolk M.D.   On: 08/22/2019 22:11   DG Chest Port 1 View  Result Date: 08/23/2019 CLINICAL DATA:  CHF, shortness of breath, swelling in legs and feet, hypertension, diabetes mellitus EXAM: PORTABLE CHEST 1 VIEW COMPARISON:  Portable exam 0601 hours compared to 08/21/2019 FINDINGS: Enlargement of cardiac silhouette post CABG. Mediastinal contours normal with atherosclerotic calcification aorta. Slight pulmonary vascular congestion. Diffuse BILATERAL pulmonary infiltrates likely representing pulmonary edema. Bibasilar effusions and atelectasis. Progressive opacity in the retrocardiac LEFT lower lobe question which may represent atelectasis or consolidation. No pneumothorax. IMPRESSION: Enlargement of cardiac silhouette  with vascular congestion and probable pulmonary edema. Bibasilar pleural effusions and atelectasis. New LEFT lower lobe opacification question atelectasis versus pneumonia. Electronically Signed   By: Lavonia Dana M.D.   On: 08/23/2019 08:29   ECHOCARDIOGRAM COMPLETE  Result Date: 08/24/2019    ECHOCARDIOGRAM REPORT   Patient Name:   Alicia Collins Arch Date of Exam: 08/24/2019 Medical Rec #:  [id number]               Height: Accession #:    [id number]  Weight:       173.0 lb Date of Birth:  [birth year]               BSA:          1.833 m Patient Age:    32 years                BP:           145/73 mmHg Patient Gender: F                       HR:           62 bpm. Exam Location:  Inpatient Procedure: 2D Echo Indications:    CHF-Acute Systolic 956.21 / H08.65  History:        Patient has prior history of Echocardiogram examinations, most                 recent 05/11/2019. CAD; Risk Factors:Hypertension, Diabetes and                 Dyslipidemia. First-degree AV block nonspecific ST-T wave                 changes. Acute kidney injury, Dementia.  Sonographer:    Darlina Sicilian RDCS Referring Phys: [id number] [place] Comments: Image acquisition challenging due to uncooperative patient. Exam ended per request IMPRESSIONS  1. Left ventricular ejection fraction, by estimation, is 45%. The left ventricle has mildly decreased function. The left ventricle has no regional Collins motion abnormalities, the basal to mid septum is severely hypokinetic. There is mild left ventricular  hypertrophy. Left ventricular diastolic parameters are consistent with Grade II diastolic dysfunction (pseudonormalization).  2. Right ventricular systolic function is moderately reduced. The right ventricular size is moderately enlarged. There is moderately elevated pulmonary artery systolic pressure. The estimated right ventricular systolic pressure is 95.2 mmHg. D-shaped interventricular septum suggestive of RV  pressure/volume overload.  3. Left atrial size was mildly dilated.  4. Right atrial size was moderately dilated.  5. The mitral valve is normal in structure. Trivial mitral valve regurgitation. No evidence of mitral stenosis.  6. Tricuspid valve regurgitation is moderate to severe.  7. The aortic valve is tricuspid. Aortic valve regurgitation is not visualized. Mild aortic valve sclerosis is present, with no evidence of aortic valve stenosis.  8. The inferior vena cava is dilated in size with <50% respiratory variability, suggesting right atrial pressure of 15 mmHg.  9. There is a pleural effusion. FINDINGS  Left Ventricle: Left ventricular ejection fraction, by estimation, is 45%. The left ventricle has mildly decreased function. The left ventricle has no regional Collins motion abnormalities. The left ventricular internal cavity size was normal in size. There is mild left ventricular hypertrophy. Left ventricular diastolic parameters are consistent with Grade II diastolic dysfunction (pseudonormalization). Right Ventricle: The right ventricular size is moderately enlarged. No increase in right ventricular Collins thickness. Right ventricular systolic function is moderately reduced. There is moderately elevated pulmonary artery systolic pressure. The tricuspid  regurgitant velocity is 3.03 m/s, and with an assumed right atrial pressure of 15 mmHg, the estimated right ventricular systolic pressure is 84.1 mmHg. Left Atrium: Left atrial size was mildly dilated. Right Atrium: Right atrial size was moderately dilated. Pericardium: There is a pleural effusion. Trivial pericardial effusion is present. Mitral Valve: The mitral valve is normal in structure. Mild mitral annular calcification. Trivial mitral valve regurgitation. No evidence of mitral valve stenosis. Tricuspid Valve:  The tricuspid valve is normal in structure. Tricuspid valve regurgitation is moderate to severe. Aortic Valve: The aortic valve is tricuspid. Aortic  valve regurgitation is not visualized. Mild aortic valve sclerosis is present, with no evidence of aortic valve stenosis. Pulmonic Valve: The pulmonic valve was normal in structure. Pulmonic valve regurgitation is trivial. Aorta: The aortic root is normal in size and structure. Venous: The inferior vena cava is dilated in size with less than 50% respiratory variability, suggesting right atrial pressure of 15 mmHg. IAS/Shunts: No atrial level shunt detected by color flow Doppler.  LEFT VENTRICLE PLAX 2D LVIDd:         4.40 cm  Diastology LVIDs:         3.70 cm  LV e' lateral:   6.45 cm/s LV PW:         1.40 cm  LV E/e' lateral: 19.1 LV IVS:        1.00 cm  LV e' medial:    6.15 cm/s LVOT diam:     1.70 cm  LV E/e' medial:  20.0 LV SV:         56 LV SV Index:   31 LVOT Area:     2.27 cm  LEFT ATRIUM           Index LA diam:      4.30 cm 2.35 cm/m LA Vol (A4C): 43.6 ml 23.78 ml/m  AORTIC VALVE LVOT Vmax:   97.90 cm/s LVOT Vmean:  61.700 cm/s LVOT VTI:    0.247 m  AORTA Ao Root diam: 3.00 cm MITRAL VALVE                TRICUSPID VALVE MV Area (PHT): 5.84 cm     TR Peak grad:   36.7 mmHg MV Decel Time: 130 msec     TR Vmax:        303.00 cm/s MV E velocity: 123.00 cm/s MV A velocity: 61.20 cm/s   SHUNTS MV E/A ratio:  2.01         Systemic VTI:  0.25 m                             Systemic Diam: 1.70 cm Loralie Champagne MD Electronically signed by Loralie Champagne MD Signature Date/Time: 08/24/2019/3:25:58 PM    Final      The results of significant diagnostics from this hospitalization (including imaging, microbiology, ancillary and laboratory) are listed below for reference.     Microbiology: Recent Results (from the past 240 hour(s))  SARS Coronavirus 2 by RT PCR (hospital order, performed in [facility] hospital lab) Nasopharyngeal Nasopharyngeal Swab     Status: None   Collection Time: 08/25/19  8:54 AM   Specimen: Nasopharyngeal Swab  Result Value Ref Range Status   SARS Coronavirus 2 NEGATIVE NEGATIVE Final     Comment: (NOTE) SARS-CoV-2 target nucleic acids are NOT DETECTED.  The SARS-CoV-2 RNA is generally detectable in upper and lower respiratory specimens during the acute phase of infection. The lowest concentration of SARS-CoV-2 viral copies this assay can detect is 250 copies / mL. A negative result does not preclude SARS-CoV-2 infection and should not be used as the sole basis for treatment or other patient management decisions.  A negative result may occur with improper specimen collection / handling, submission of specimen other than nasopharyngeal swab, presence of viral mutation(s) within the areas targeted by this assay, and inadequate number of viral copies (<  250 copies / mL). A negative result must be combined with clinical observations, patient history, and epidemiological information.  Fact Sheet for Patients:   StrictlyIdeas.no  Fact Sheet for Healthcare Providers: BankingDealers.co.za  This test is not yet approved or  cleared by the [place] FDA and has been authorized for detection and/or diagnosis of SARS-CoV-2 by FDA under an Emergency Use Authorization (EUA).  This EUA will remain in effect (meaning this test can be used) for the duration of the COVID-19 declaration under Section 564(b)(1) of the Act, 21 U.S.C. section 360bbb-3(b)(1), unless the authorization is terminated or revoked sooner.  Performed at [facility], [place] [address], [place], [place] [address]      Labs: BNP (last 3 results) No results for input(s): BNP in the last 8760 hours. Basic Metabolic Panel: Recent Labs  Lab 08/22/19 2037 08/22/19 2037 08/23/19 0823 08/24/19 1130 08/25/19 0520 08/26/19 0418 08/27/19 0425  NA 125*   < > 126* 126* 127* 129* 128*  K 5.2*   < > 4.5 4.6 4.7 4.6 4.5  CL 90*   < > 90* 90* 89* 92* 89*  CO2 22   < > 23 22 25 25 26   GLUCOSE 122*   < > 191* 118* 140* 131* 120*  BUN 86*   < > 88* 87* 91* 92*  96*  CREATININE 3.05*   < > 3.12* 3.30* 3.25* 3.30* 3.51*  CALCIUM 8.8*   < > 8.7* 8.7* 9.2 9.1 9.1  MG 3.0*  --   --  3.1*  --   --   --    < > = values in this interval not displayed.   Liver Function Tests: Recent Labs  Lab 08/24/19 1130  AST 29  ALT 24  ALKPHOS 325*  BILITOT 1.2  PROT 5.3*  ALBUMIN 2.6*   No results for input(s): LIPASE, AMYLASE in the last 168 hours. No results for input(s): AMMONIA in the last 168 hours. CBC: Recent Labs  Lab 08/23/19 0823 08/24/19 1130  WBC 7.3 6.7  HGB 8.5* 8.5*  HCT 25.8* 25.6*  MCV 91.2 90.1  PLT 150 142*   Cardiac Enzymes: No results for input(s): CKTOTAL, CKMB, CKMBINDEX, TROPONINI in the last 168 hours. BNP: Invalid input(s): POCBNP CBG: Recent Labs  Lab 08/26/19 0605 08/26/19 1120 08/26/19 1646 08/26/19 2047 08/27/19 0606  GLUCAP 109* 121* 97 165* 126*   D-Dimer No results for input(s): DDIMER in the last 72 hours. Hgb A1c No results for input(s): HGBA1C in the last 72 hours. Lipid Profile No results for input(s): CHOL, HDL, LDLCALC, TRIG, CHOLHDL, LDLDIRECT in the last 72 hours. Thyroid function studies No results for input(s): TSH, T4TOTAL, T3FREE, THYROIDAB in the last 72 hours.  Invalid input(s): FREET3 Anemia work up No results for input(s): VITAMINB12, FOLATE, FERRITIN, TIBC, IRON, RETICCTPCT in the last 72 hours. Urinalysis    Component Value Date/Time   COLORURINE YELLOW 08/22/2019 2226   APPEARANCEUR HAZY (A) 08/22/2019 2226   LABSPEC 1.006 08/22/2019 2226   PHURINE 5.0 08/22/2019 2226   GLUCOSEU 50 (A) 08/22/2019 2226   HGBUR LARGE (A) 08/22/2019 2226   BILIRUBINUR NEGATIVE 08/22/2019 2226   KETONESUR NEGATIVE 08/22/2019 2226   PROTEINUR 30 (A) 08/22/2019 2226   NITRITE NEGATIVE 08/22/2019 2226   LEUKOCYTESUR SMALL (A) 08/22/2019 2226   Sepsis Labs Invalid input(s): PROCALCITONIN,  WBC,  LACTICIDVEN Microbiology Recent Results (from the past 240 hour(s))  SARS Coronavirus 2 by RT PCR  (hospital order, performed in [facility] hospital lab) Nasopharyngeal Nasopharyngeal Swab     Status: None   Collection Time: 08/25/19  8:54 AM   Specimen: Nasopharyngeal Swab  Result Value Ref Range Status   SARS Coronavirus 2 NEGATIVE NEGATIVE Final    Comment: (NOTE) SARS-CoV-2 target nucleic acids are NOT DETECTED.  The SARS-CoV-2 RNA is generally detectable in upper and lower respiratory specimens during the acute phase of infection. The lowest concentration of SARS-CoV-2 viral copies this assay can detect is 250 copies / mL. A negative result does not preclude SARS-CoV-2 infection and should not be used as the sole basis for treatment or other patient management decisions.  A negative result may occur with improper specimen collection / handling, submission of specimen other than nasopharyngeal swab, presence of viral mutation(s) within the areas targeted by this assay, and inadequate number of viral copies (<250 copies / mL). A negative result must be combined with clinical observations, patient history, and epidemiological information.  Fact Sheet for Patients:   StrictlyIdeas.no  Fact Sheet for Healthcare Providers: BankingDealers.co.za  This test is not yet approved or  cleared by the [place] FDA and has been authorized for detection and/or diagnosis of SARS-CoV-2 by FDA under an Emergency Use Authorization (EUA).  This EUA will remain in effect (meaning this test can be used) for the duration of the COVID-19 declaration under Section 564(b)(1) of the Act, 21 U.S.C. section 360bbb-3(b)(1), unless the authorization is terminated or revoked sooner.  Performed at [facility], [place] [address] [place], [place] [address]      Time coordinating discharge in minutes: 87  SIGNED:   Debbe Odea, MD  [facility] 08/27/2019, 11:21 AM

## 2019-08-27 NOTE — Progress Notes (Signed)
Progress Note  Patient Name: Alicia Collins Date of Encounter: 08/27/2019  [facility] Cardiologist: Jenean Lindau, MD   Subjective   Made hospice care Telemetry stopped   Inpatient Medications    Scheduled Meds: . acetaminophen  1,000 mg Oral TID  . atorvastatin  40 mg Oral Daily  . Chlorhexidine Gluconate Cloth  6 each Topical Daily  . diphenhydrAMINE  25 mg Oral BID  . diphenhydrAMINE  12.5 mg Intravenous Once  . ferrous sulfate  325 mg Oral Daily  . furosemide  40 mg Oral BID  . insulin aspart  0-6 Units Subcutaneous TID WC  . insulin glargine  10 Units Subcutaneous QHS  . levothyroxine  50 mcg Oral Q0600  . lidocaine  2 patch Transdermal Q24H  . liver oil-zinc oxide   Topical QID  . omega-3 acid ethyl esters  1 g Oral Daily  . polyethylene glycol  17 g Oral Daily  . sodium chloride flush  3 mL Intravenous Q12H   Continuous Infusions: . sodium chloride     PRN Meds: sodium chloride, albuterol, bisacodyl, bisacodyl, fentaNYL (SUBLIMAZE) injection, ondansetron (ZOFRAN) IV, sodium chloride flush   Vital Signs    Vitals:   08/26/19 1212 08/26/19 2034 08/27/19 0043 08/27/19 0314  BP: (!) 111/52 (!) 141/60  (!) 122/44  Pulse: (!) 54 (!) 55  (!) 51  Resp: 18 17  16   Temp: (!) 97.4 F (36.3 C)     TempSrc:      SpO2: 91% 100%  98%  Weight:   80.3 kg     Intake/Output Summary (Last 24 hours) at 08/27/2019 0940 Last data filed at 08/27/2019 0900 Gross per 24 hour  Intake 360 ml  Output 725 ml  Net -365 ml   Last 3 Weights 08/27/2019 08/25/2019 08/25/2019  Weight (lbs) 177 lb 174 lb 173 lb  Weight (kg) 80.287 kg 78.926 kg 78.472 kg      Telemetry    NSR 08/27/2019   ECG    Sinus rhythm first-degree AV block nonspecific ST-T wave changes.- Personally Reviewed  Physical Exam   Affect appropriate Frail elderly white female  HEENT: normal Neck supple with no adenopathy JVP normal no bruits no thyromegaly Lungs clear with no wheezing and good  diaphragmatic motion Heart:  S1/S2 no murmur, no rub, gallop or click PMI normal Abdomen: drainage tube GB Foley soft non tender  Distal pulses intact with no bruits Plus one edema with dressings over shins  Neuro non-focal Skin warm and dry No muscular weakness    Labs    High Sensitivity Troponin:  No results for input(s): TROPONINIHS in the last 720 hours.    Chemistry Recent Labs  Lab 08/24/19 1130 08/24/19 1130 08/25/19 0520 08/26/19 0418 08/27/19 0425  NA 126*   < > 127* 129* 128*  K 4.6   < > 4.7 4.6 4.5  CL 90*   < > 89* 92* 89*  CO2 22   < > 25 25 26   GLUCOSE 118*   < > 140* 131* 120*  BUN 87*   < > 91* 92* 96*  CREATININE 3.30*   < > 3.25* 3.30* 3.51*  CALCIUM 8.7*   < > 9.2 9.1 9.1  PROT 5.3*  --   --   --   --   ALBUMIN 2.6*  --   --   --   --   AST 29  --   --   --   --  ALT 24  --   --   --   --   ALKPHOS 325*  --   --   --   --   BILITOT 1.2  --   --   --   --   GFRNONAA 13*   < > 13* 13* 12*  GFRAA 15*   < > 15* 15* 14*  ANIONGAP 14   < > 13 12 13    < > = values in this interval not displayed.     Hematology Recent Labs  Lab 08/23/19 0823 08/24/19 1130  WBC 7.3 6.7  RBC 2.83* 2.84*  HGB 8.5* 8.5*  HCT 25.8* 25.6*  MCV 91.2 90.1  MCH 30.0 29.9  MCHC 32.9 33.2  RDW 20.0* 19.8*  PLT 150 142*    BNPNo results for input(s): BNP, PROBNP in the last 168 hours.   DDimer No results for input(s): DDIMER in the last 168 hours.   Radiology    Cardiac Studies   Prior echo EF 35 to 40%  Current echo:  1. Left ventricular ejection fraction, by estimation, is 45%. The left  ventricle has mildly decreased function. The left ventricle has no  regional wall motion abnormalities, the basal to mid septum is severely  hypokinetic. There is mild left ventricular  hypertrophy. Left ventricular diastolic parameters are consistent with  Grade II diastolic dysfunction (pseudonormalization).  2. Right ventricular systolic function is moderately  reduced. The right  ventricular size is moderately enlarged. There is moderately elevated  pulmonary artery systolic pressure. The estimated right ventricular  systolic pressure is 89.1 mmHg. D-shaped  interventricular septum suggestive of RV pressure/volume overload.  3. Left atrial size was mildly dilated.  4. Right atrial size was moderately dilated.  5. The mitral valve is normal in structure. Trivial mitral valve  regurgitation. No evidence of mitral stenosis.  6. Tricuspid valve regurgitation is moderate to severe.  7. The aortic valve is tricuspid. Aortic valve regurgitation is not  visualized. Mild aortic valve sclerosis is present, with no evidence of  aortic valve stenosis.  8. The inferior vena cava is dilated in size with <50% respiratory  variability, suggesting right atrial pressure of 15 mmHg.  9. There is a pleural effusion.   Patient Profile     79 y.o. female with known CAD post CABG several years ago with ischemic cardiomyopathy EF 35 to 40%, cholecystitis with percutaneous drain, confusion with baseline mild dementia according past medical history. And now renal failure   Assessment & Plan    Acute on chronic systolic heart failure-EF 45% mildly reduced with secondary pulmonary hypertension moderate. -oral lasix   -Agree with palliative care assistance.  Agree with DNR.  Acute kidney injury -Cr 3.5 lasix dose decreased and made oral   GB:  Still has tube in ? To be dealt with in [place] next week   She is to be d/c to hospice care Cardiology will sign off    For questions or updates, please contact Roland Please consult [email or web address] for contact info under        Signed, Jenkins Rouge, MD  08/27/2019, 9:40 AM

## 2019-09-17 DEATH — deceased

## 2019-09-30 DIAGNOSIS — M6281 Muscle weakness (generalized): Secondary | ICD-10-CM

## 2019-09-30 DIAGNOSIS — Z7189 Other specified counseling: Secondary | ICD-10-CM

## 2019-09-30 DIAGNOSIS — K5900 Constipation, unspecified: Secondary | ICD-10-CM

## 2019-09-30 DIAGNOSIS — R410 Disorientation, unspecified: Secondary | ICD-10-CM

## 2021-03-06 IMAGING — US US RENAL
1 series · 14 of 25 positions shown · non-contrast
Comparison: None.

CLINICAL DATA: Acute renal insufficiency.

EXAM:
RENAL / URINARY TRACT ULTRASOUND COMPLETE

[Series 1: us renal · 14 of 35 slices shown]
[im 1/35]
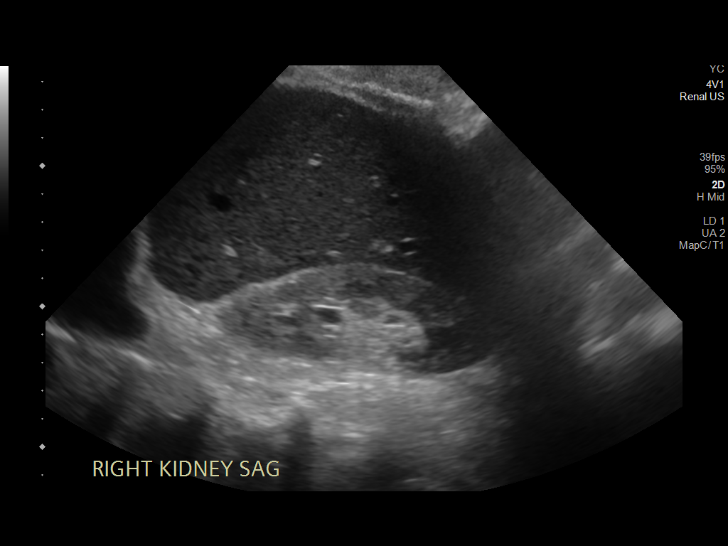
[im 3/35]
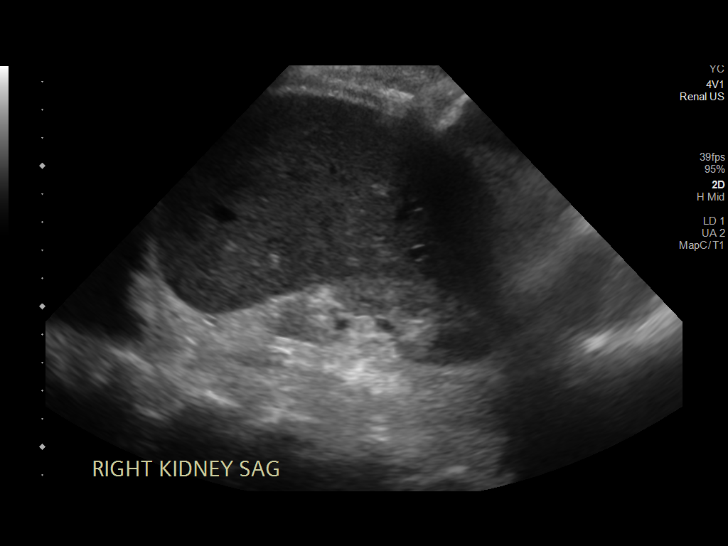
[im 6/35]
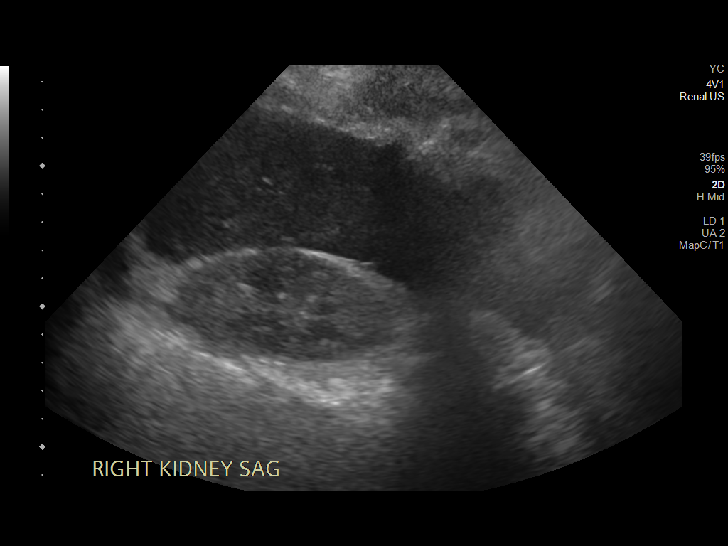
[im 9/35]
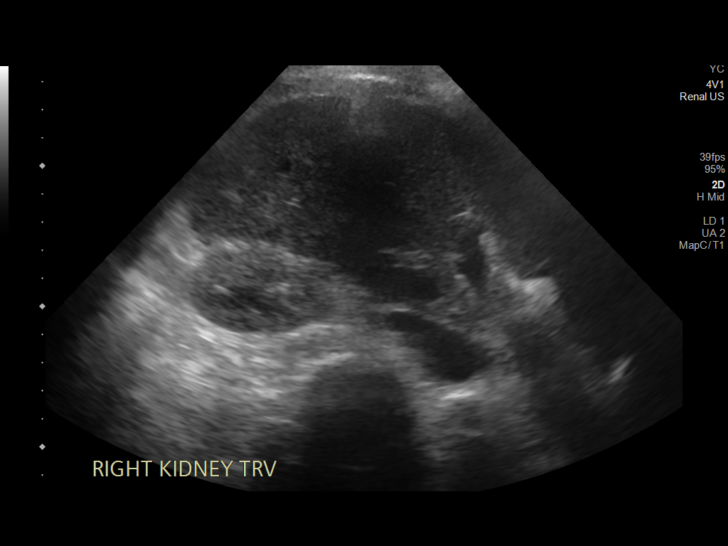
[im 12/35]
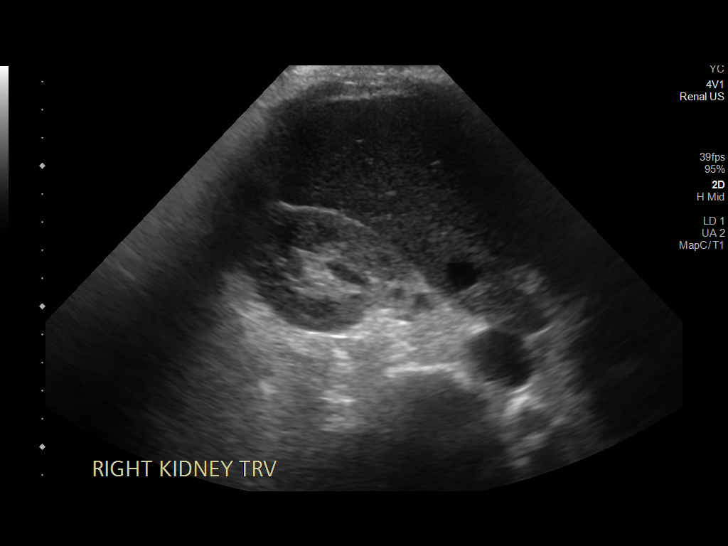
[im 13/35]
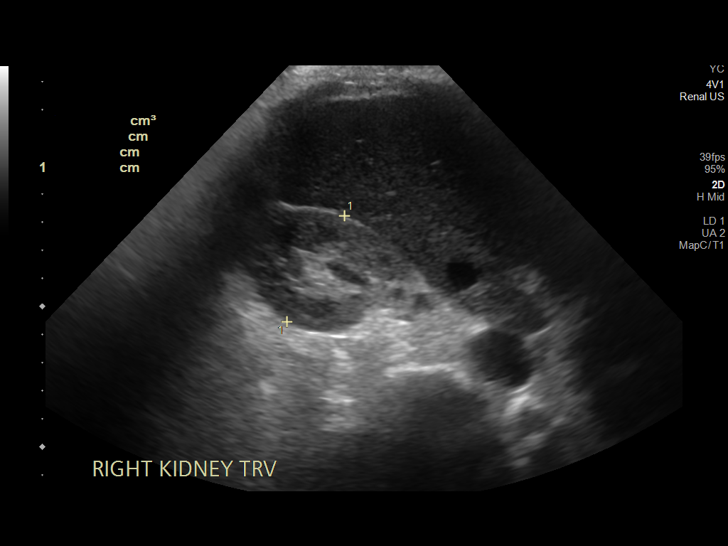
[im 16/35]
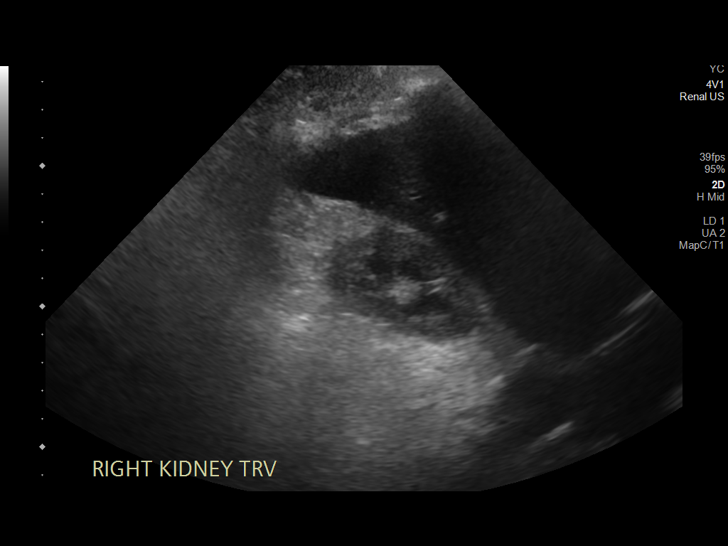
[im 19/35]
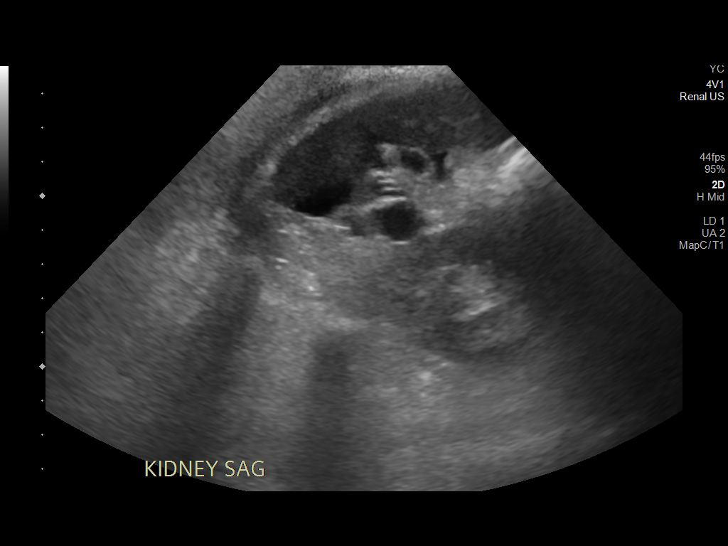
[im 22/35]
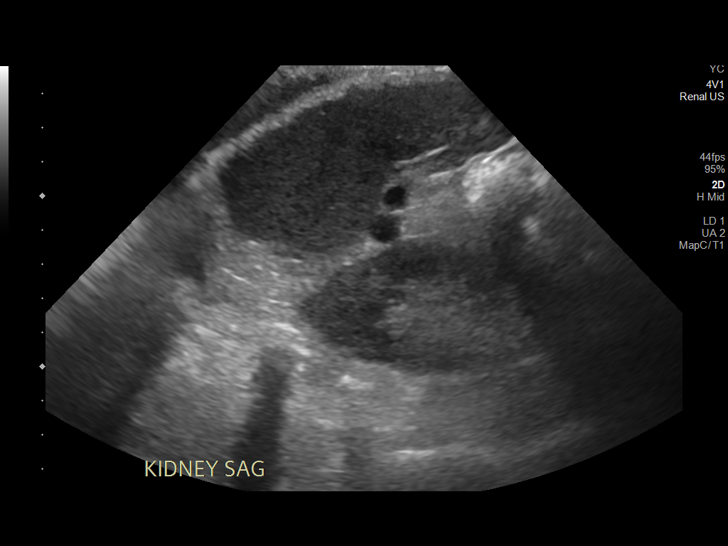
[im 23/35]
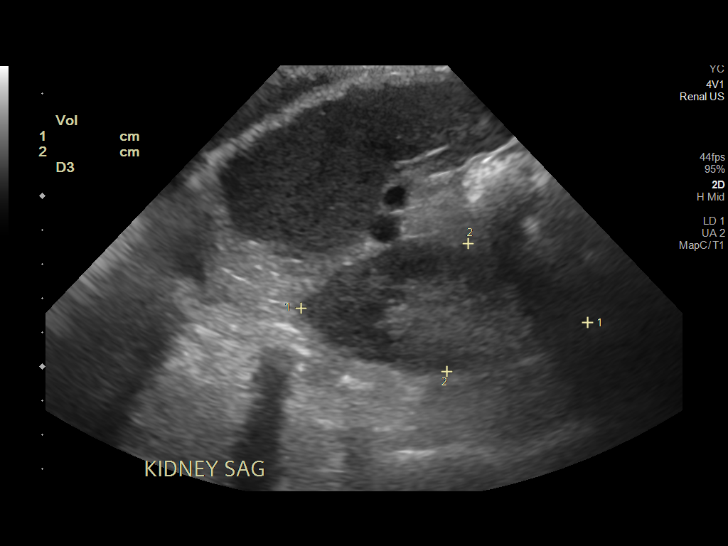
[im 26/35]
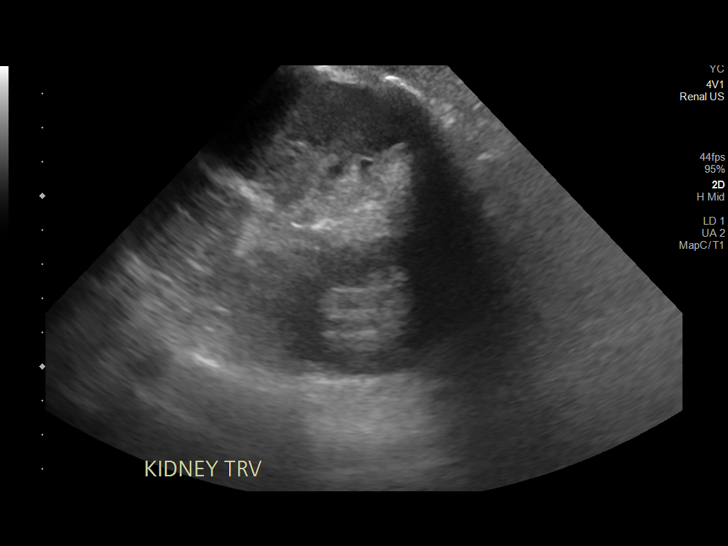
[im 29/35]
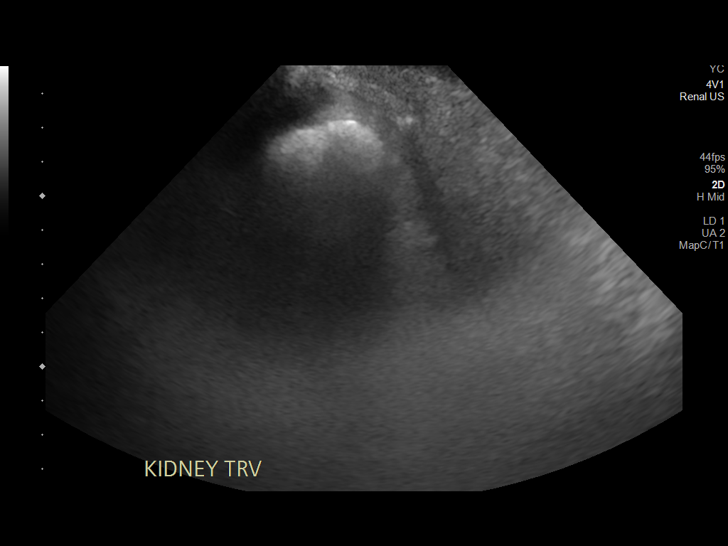
[im 32/35]
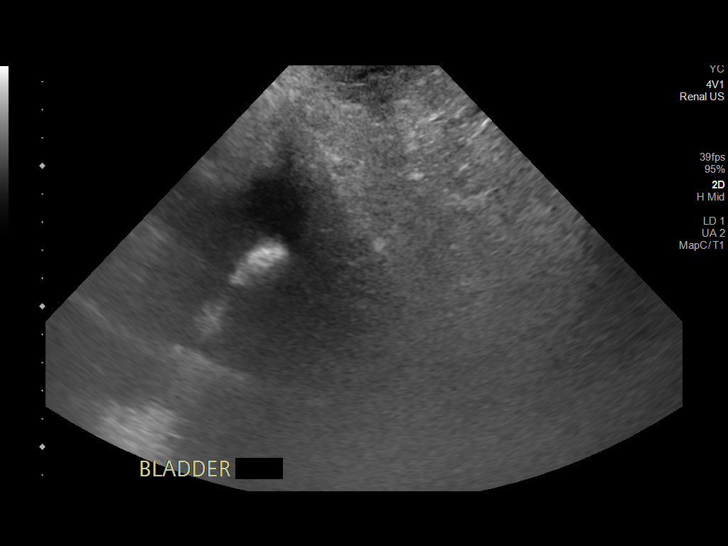
[im 35/35]
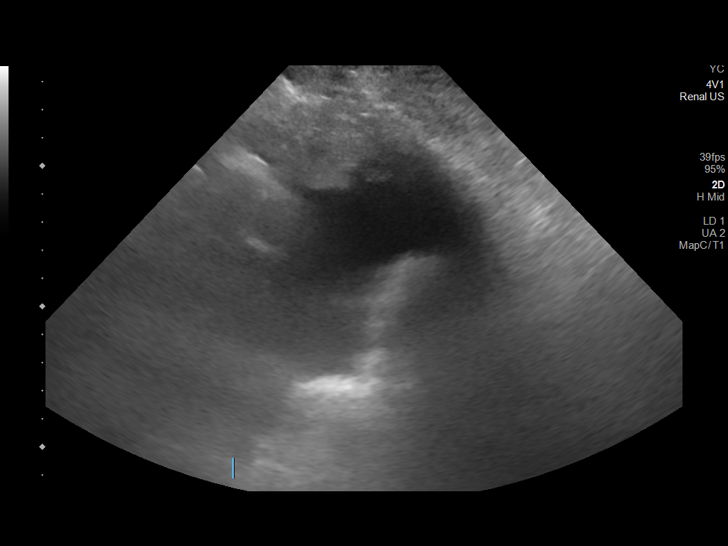

[14 of 25 positions shown; findings below may reference images not displayed]

FINDINGS: Right Kidney:

Renal measurements: 10.8 cm x 4.1 cm x 4.3 cm = volume: 99.9 mL.
There is diffusely increased echogenicity of the renal parenchyma.
No mass or hydronephrosis visualized.

Left Kidney:

Renal measurements: 8.4 cm x 3.8 cm x 4.1 cm = volume: 68.6 mL.
Echogenicity within normal limits. No mass or hydronephrosis
visualized.

Bladder:

A Foley catheter is within the urinary bladder.

Other:

There is a mild amount of abdominal free fluid.

Bilateral pleural effusions are seen.
IMPRESSION: 1. Increased echogenicity of the right kidney which may be secondary
to medical renal disease.
2. Mild amount of abdominal free fluid.
3. Bilateral pleural effusions.

## 2021-03-07 IMAGING — DX DG CHEST 1V PORT
1 series · 1 of 1 positions shown · non-contrast
Comparison: Portable exam 8486 hours compared to 08/21/2019

CLINICAL DATA: CHF, shortness of breath, swelling in legs and feet,
hypertension, diabetes mellitus

EXAM:
PORTABLE CHEST 1 VIEW

[chest ap]
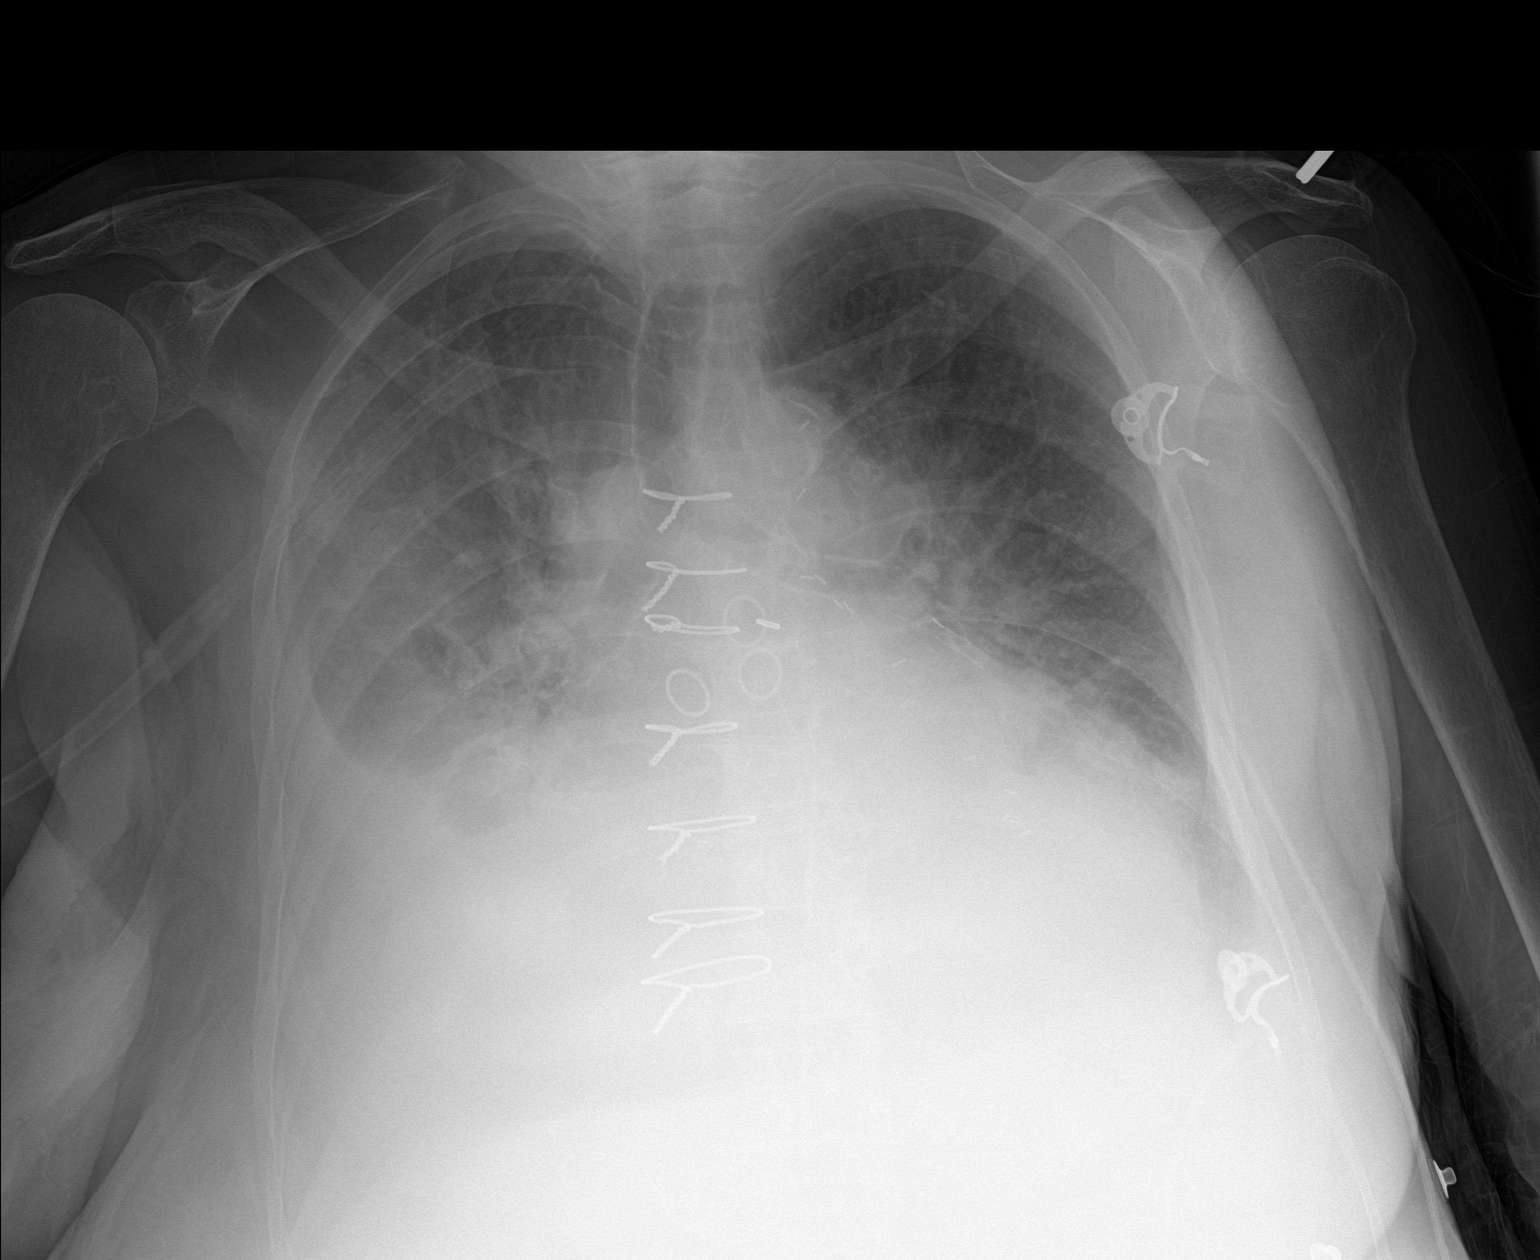

[1 of 1 positions shown; findings below may reference images not displayed]

FINDINGS: Enlargement of cardiac silhouette post CABG.

Mediastinal contours normal with atherosclerotic calcification
aorta.

Slight pulmonary vascular congestion.

Diffuse BILATERAL pulmonary infiltrates likely representing
pulmonary edema.

Bibasilar effusions and atelectasis.

Progressive opacity in the retrocardiac LEFT lower lobe question
which may represent atelectasis or consolidation.

No pneumothorax.
IMPRESSION: Enlargement of cardiac silhouette with vascular congestion and
probable pulmonary edema.

Bibasilar pleural effusions and atelectasis.

New LEFT lower lobe opacification question atelectasis versus
pneumonia.
